# Patient Record
Sex: Male | Born: 1985 | Race: Black or African American | Hispanic: No | Marital: Single | Smoking: Former smoker
Health system: Southern US, Community
[De-identification: ages and names within clinical notes are randomized; demographics above are authoritative.]

## PROBLEM LIST (undated history)

## (undated) DIAGNOSIS — B019 Varicella without complication: Secondary | ICD-10-CM

## (undated) DIAGNOSIS — J45909 Unspecified asthma, uncomplicated: Secondary | ICD-10-CM

## (undated) HISTORY — DX: Unspecified asthma, uncomplicated: J45.909

## (undated) HISTORY — DX: Varicella without complication: B01.9

## (undated) HISTORY — PX: HAND SURGERY: SHX662

---

## 2015-05-26 ENCOUNTER — Ambulatory Visit: Payer: Self-pay | Admitting: Family

## 2016-09-14 ENCOUNTER — Ambulatory Visit (INDEPENDENT_AMBULATORY_CARE_PROVIDER_SITE_OTHER): Payer: BC Managed Care – PPO | Admitting: Family

## 2016-09-14 ENCOUNTER — Encounter: Payer: Self-pay | Admitting: Family

## 2016-09-14 VITALS — BP 124/88 | HR 73 | Temp 98.1°F | Resp 16 | Ht 69.0 in | Wt 216.0 lb

## 2016-09-14 DIAGNOSIS — Z0000 Encounter for general adult medical examination without abnormal findings: Secondary | ICD-10-CM | POA: Insufficient documentation

## 2016-09-14 NOTE — Assessment & Plan Note (Signed)
1) Anticipatory Guidance: Discussed importance of wearing a seatbelt while driving and not texting while driving; changing batteries in smoke detector at least once annually; wearing suntan lotion when outside; eating a balanced and moderate diet; getting physical activity at least 30 minutes per day.  2) Immunizations / Screenings / Labs:  Declines tetanus. All other immunizations are up-to-date per recommendations. Obtain HIV antibody for HIV screening. Due for dental exam encouraged to be completed independently. All other screenings are up-to-date per recommendations. Obtain CBC, CMET, and lipid profile.    Overall well exam with minimal risk factors for cardiovascular disease. He is a current some day smoker although endorses very infrequently. Encouraged to cease tobacco use completely to reduce risk for cardiovascular, respiratory, or malignant disease in the future. He exercises regularly and needs a nutritional intake that is moderate, balance, and varied. Continue other healthy lifestyle behaviors and choices. Follow-up prevention exam in 1 year. Follow-up office visit pending blood work as needed.

## 2016-09-14 NOTE — Patient Instructions (Addendum)
Thank you for choosing [facility].  SUMMARY AND INSTRUCTIONS:  Labs:  Please stop by the lab on the lower level of the building for your blood work. Your results will be released to MyChart (or called to you) after review, usually within 72 hours after test completion. If any changes need to be made, you will be notified at that same time.  1.) The lab is open from 7:30am to 5:30 pm Monday-Friday 2.) No appointment is necessary 3.) Fasting (if needed) is 6-8 hours after food and drink; black coffee and water are okay   Follow up:  If your symptoms worsen or fail to improve, please contact our office for further instruction, or in case of emergency go directly to the emergency room at the closest medical facility.    Health Maintenance, Male A healthy lifestyle and preventive care is important for your health and wellness. Ask your health care provider about what schedule of regular examinations is right for you. What should I know about weight and diet?  Eat a Healthy Diet  Eat plenty of vegetables, fruits, whole grains, low-fat dairy products, and lean protein.  Do not eat a lot of foods high in solid fats, added sugars, or salt. Maintain a Healthy Weight  Regular exercise can help you achieve or maintain a healthy weight. You should:  Do at least 150 minutes of exercise each week. The exercise should increase your heart rate and make you sweat (moderate-intensity exercise).  Do strength-training exercises at least twice a week. Watch Your Levels of Cholesterol and Blood Lipids  Have your blood tested for lipids and cholesterol every 5 years starting at 32 years of age. If you are at high risk for heart disease, you should start having your blood tested when you are 32 years old. You may need to have your cholesterol levels checked more often if:  Your lipid or cholesterol levels are high.  You are older than 32 years of age.  You are at high risk for heart  disease. What should I know about cancer screening? Many types of cancers can be detected early and may often be prevented. Lung Cancer  You should be screened every year for lung cancer if:  You are a current smoker who has smoked for at least 30 years.  You are a former smoker who has quit within the past 15 years.  Talk to your health care provider about your screening options, when you should start screening, and how often you should be screened. Colorectal Cancer  Routine colorectal cancer screening usually begins at 32 years of age and should be repeated every 5-10 years until you are 32 years old. You may need to be screened more often if early forms of precancerous polyps or small growths are found. Your health care provider may recommend screening at an earlier age if you have risk factors for colon cancer.  Your health care provider may recommend using home test kits to check for hidden blood in the stool.  A small camera at the end of a tube can be used to examine your colon (sigmoidoscopy or colonoscopy). This checks for the earliest forms of colorectal cancer. Prostate and Testicular Cancer  Depending on your age and overall health, your health care provider may do certain tests to screen for prostate and testicular cancer.  Talk to your health care provider about any symptoms or concerns you have about testicular or prostate cancer. Skin Cancer  Check your skin from head to   toe regularly.  Tell your health care provider about any new moles or changes in moles, especially if:  There is a change in a mole's size, shape, or color.  You have a mole that is larger than a pencil eraser.  Always use sunscreen. Apply sunscreen liberally and repeat throughout the day.  Protect yourself by wearing long sleeves, pants, a wide-brimmed hat, and sunglasses when outside. What should I know about heart disease, diabetes, and high blood pressure?  If you are 18-39 years of age,  have your blood pressure checked every 3-5 years. If you are 40 years of age or older, have your blood pressure checked every year. You should have your blood pressure measured twice-once when you are at a hospital or clinic, and once when you are not at a hospital or clinic. Record the average of the two measurements. To check your blood pressure when you are not at a hospital or clinic, you can use:  An automated blood pressure machine at a pharmacy.  A home blood pressure monitor.  Talk to your health care provider about your target blood pressure.  If you are between 45-79 years old, ask your health care provider if you should take aspirin to prevent heart disease.  Have regular diabetes screenings by checking your fasting blood sugar level.  If you are at a normal weight and have a low risk for diabetes, have this test once every three years after the age of 45.  If you are overweight and have a high risk for diabetes, consider being tested at a younger age or more often.  A one-time screening for abdominal aortic aneurysm (AAA) by ultrasound is recommended for men aged 65-75 years who are current or former smokers. What should I know about preventing infection? Hepatitis B  If you have a higher risk for hepatitis B, you should be screened for this virus. Talk with your health care provider to find out if you are at risk for hepatitis B infection. Hepatitis C  Blood testing is recommended for:  Everyone born from 1945 through 1965.  Anyone with known risk factors for hepatitis C. Sexually Transmitted Diseases (STDs)  You should be screened each year for STDs including gonorrhea and chlamydia if:  You are sexually active and are younger than 32 years of age.  You are older than 32 years of age and your health care provider tells you that you are at risk for this type of infection.  Your sexual activity has changed since you were last screened and you are at an increased risk for  chlamydia or gonorrhea. Ask your health care provider if you are at risk.  Talk with your health care provider about whether you are at high risk of being infected with HIV. Your health care provider may recommend a prescription medicine to help prevent HIV infection. What else can I do?  Schedule regular health, dental, and eye exams.  Stay current with your vaccines (immunizations).  Do not use any tobacco products, such as cigarettes, chewing tobacco, and e-cigarettes. If you need help quitting, ask your health care provider.  Limit alcohol intake to no more than 2 drinks per day. One drink equals 12 ounces of beer, 5 ounces of wine, or 1 ounces of hard liquor.  Do not use street drugs.  Do not share needles.  Ask your health care provider for help if you need support or information about quitting drugs.  Tell your health care provider if you often   feel depressed.  Tell your health care provider if you have ever been abused or do not feel safe at home. This information is not intended to replace advice given to you by your health care provider. Make sure you discuss any questions you have with your health care provider. Document Released: 10/08/2007 Document Revised: 12/09/2015 Document Reviewed: 01/13/2015 Elsevier Interactive Patient Education  2017 [employer]   

## 2016-09-14 NOTE — Progress Notes (Signed)
Subjective:    Patient ID: Danny Rubio, male    DOB: 09/24/1985, 31 y.o.   MRN: [id number]  Chief Complaint  Patient presents with  . Establish Care    CPE, not fasting    HPI:  Danny Rubio is a 31 y.o. male who presents today for an annual wellness visit.   1) Health Maintenance -   Diet - Averages about 2 meals per day consisting of a low fat diet; Minimal caffeine intake  Exercise - 3-4x per week mixture of cardio and resistance   2) Preventative Exams / Immunizations:  Dental -- Due for exam  Vision -- Up to date   Health Maintenance  Topic Date Due  . HIV Screening  07/02/1999  . TETANUS/TDAP  07/02/2003  . INFLUENZA VACCINE  11/23/2016     There is no immunization history on file for this patient.   No Known Allergies   No outpatient prescriptions prior to visit.   No facility-administered medications prior to visit.      Past Medical History:  Diagnosis Date  . Asthma    As a child  . Chicken pox      Past Surgical History:  Procedure Laterality Date  . HAND SURGERY       Family History  Problem Relation Age of Onset  . Hypertension Mother      Social History   Social History  . Marital status: Single    Spouse name: N/A  . Number of children: 0  . Years of education: 12   Occupational History  . Custodian    Social History Main Topics  . Smoking status: Current Some Day Smoker    Types: Cigarettes  . Smokeless tobacco: Never Used  . Alcohol use Yes     Comment: Occasional  . Drug use: No  . Sexual activity: Not on file   Other Topics Concern  . Not on file   Social History Narrative   Fun/Hobby: Gym, walk, running, swimming      Review of Systems  Constitutional: Denies fever, chills, fatigue, or significant weight gain/loss. HENT: Head: Denies headache or neck pain Ears: Denies changes in hearing, ringing in ears, earache, drainage Nose: Denies discharge, stuffiness, itching, nosebleed, sinus  pain Throat: Denies sore throat, hoarseness, dry mouth, sores, thrush Eyes: Denies loss/changes in vision, pain, redness, blurry/double vision, flashing lights Cardiovascular: Denies chest pain/discomfort, tightness, palpitations, shortness of breath with activity, difficulty lying down, swelling, sudden awakening with shortness of breath Respiratory: Denies shortness of breath, cough, sputum production, wheezing Gastrointestinal: Denies dysphasia, heartburn, change in appetite, nausea, change in bowel habits, rectal bleeding, constipation, diarrhea, yellow skin or eyes Genitourinary: Denies frequency, urgency, burning/pain, blood in urine, incontinence, change in urinary strength. Musculoskeletal: Denies muscle/joint pain, stiffness, back pain, redness or swelling of joints, trauma Skin: Denies rashes, lumps, itching, dryness, color changes, or hair/nail changes Neurological: Denies dizziness, fainting, seizures, weakness, numbness, tingling, tremor Psychiatric - Denies nervousness, stress, depression or memory loss Endocrine: Denies heat or cold intolerance, sweating, frequent urination, excessive thirst, changes in appetite Hematologic: Denies ease of bruising or bleeding     Objective:     BP 124/88 (BP Location: Left Arm, Patient Position: Sitting, Cuff Size: Large)   Pulse 73   Temp 98.1 F (36.7 C) (Oral)   Resp 16   Ht 5\' 9"  (1.753 m)   Wt 216 lb (98 kg)   SpO2 97%   BMI 31.90 kg/m  Nursing note and vital signs reviewed.  Physical Exam  Constitutional: He is oriented to person, place, and time. He appears well-developed and well-nourished.  HENT:  Head: Normocephalic.  Right Ear: Hearing, tympanic membrane, external ear and ear canal normal.  Left Ear: Hearing, tympanic membrane, external ear and ear canal normal.  Nose: Nose normal.  Mouth/Throat: Uvula is midline, oropharynx is clear and moist and mucous membranes are normal.  Eyes: Conjunctivae and EOM are normal.  Pupils are equal, round, and reactive to light.  Neck: Neck supple. No JVD present. No tracheal deviation present. No thyromegaly present.  Cardiovascular: Normal rate, regular rhythm, normal heart sounds and intact distal pulses.   Pulmonary/Chest: Effort normal and breath sounds normal.  Abdominal: Soft. Bowel sounds are normal. He exhibits no distension and no mass. There is no tenderness. There is no rebound and no guarding.  Musculoskeletal: Normal range of motion. He exhibits no edema or tenderness.  Lymphadenopathy:    He has no cervical adenopathy.  Neurological: He is alert and oriented to person, place, and time. He has normal reflexes. No cranial nerve deficit. He exhibits normal muscle tone. Coordination normal.  Skin: Skin is warm and dry.  Psychiatric: He has a normal mood and affect. His behavior is normal. Judgment and thought content normal.       Assessment & Plan:   Problem List Items Addressed This Visit      Other   Routine adult health maintenance - Primary    1) Anticipatory Guidance: Discussed importance of wearing a seatbelt while driving and not texting while driving; changing batteries in smoke detector at least once annually; wearing suntan lotion when outside; eating a balanced and moderate diet; getting physical activity at least 30 minutes per day.  2) Immunizations / Screenings / Labs:  Declines tetanus. All other immunizations are up-to-date per recommendations. Obtain HIV antibody for HIV screening. Due for dental exam encouraged to be completed independently. All other screenings are up-to-date per recommendations. Obtain CBC, CMET, and lipid profile.    Overall well exam with minimal risk factors for cardiovascular disease. He is a current some day smoker although endorses very infrequently. Encouraged to cease tobacco use completely to reduce risk for cardiovascular, respiratory, or malignant disease in the future. He exercises regularly and needs a  nutritional intake that is moderate, balance, and varied. Continue other healthy lifestyle behaviors and choices. Follow-up prevention exam in 1 year. Follow-up office visit pending blood work as needed.      Relevant Orders   CBC   Comprehensive metabolic panel   Lipid panel   HIV antibody       Mr. Caraway does not currently have medications on file.   Follow-up: Return in about 1 year (around 09/14/2017), or if symptoms worsen or fail to improve.   Jeanine Luz, FNP

## 2016-09-16 ENCOUNTER — Other Ambulatory Visit (INDEPENDENT_AMBULATORY_CARE_PROVIDER_SITE_OTHER): Payer: BC Managed Care – PPO

## 2016-09-16 DIAGNOSIS — Z0000 Encounter for general adult medical examination without abnormal findings: Secondary | ICD-10-CM

## 2016-09-16 LAB — CBC
HCT: 45.7 % (ref 39.0–52.0)
Hemoglobin: 15.3 g/dL (ref 13.0–17.0)
MCHC: 33.5 g/dL (ref 30.0–36.0)
MCV: 90.3 fl (ref 78.0–100.0)
PLATELETS: 253 10*3/uL (ref 150.0–400.0)
RBC: 5.06 Mil/uL (ref 4.22–5.81)
RDW: 12.7 % (ref 11.5–15.5)
WBC: 4.9 10*3/uL (ref 4.0–10.5)

## 2016-09-16 LAB — LIPID PANEL
Cholesterol: 191 mg/dL (ref 0–200)
HDL: 26.3 mg/dL — AB (ref >39.00)
LDL Cholesterol: 130 mg/dL — ABNORMAL HIGH (ref 0–99)
NonHDL: 164.54
TRIGLYCERIDES: 173 mg/dL — AB (ref 0.0–149.0)
Total CHOL/HDL Ratio: 7
VLDL: 34.6 mg/dL (ref 0.0–40.0)

## 2016-09-16 LAB — COMPREHENSIVE METABOLIC PANEL
ALT: 31 U/L (ref 0–53)
AST: 20 U/L (ref 0–37)
Albumin: 4.6 g/dL (ref 3.5–5.2)
Alkaline Phosphatase: 56 U/L (ref 39–117)
BUN: 22 mg/dL (ref 6–23)
CHLORIDE: 108 meq/L (ref 96–112)
CO2: 23 meq/L (ref 19–32)
CREATININE: 1 mg/dL (ref 0.40–1.50)
Calcium: 9.4 mg/dL (ref 8.4–10.5)
GFR: 111.22 mL/min (ref >60.00)
Glucose, Bld: 113 mg/dL — ABNORMAL HIGH (ref 70–99)
Potassium: 3.9 mEq/L (ref 3.5–5.1)
Sodium: 138 mEq/L (ref 135–145)
Total Bilirubin: 0.4 mg/dL (ref 0.2–1.2)
Total Protein: 7.6 g/dL (ref 6.0–8.3)

## 2016-09-17 LAB — HIV ANTIBODY (ROUTINE TESTING W REFLEX): HIV: NONREACTIVE

## 2016-12-03 ENCOUNTER — Encounter (HOSPITAL_COMMUNITY): Payer: Self-pay | Admitting: Emergency Medicine

## 2016-12-03 ENCOUNTER — Emergency Department (HOSPITAL_COMMUNITY)
Admission: EM | Admit: 2016-12-03 | Discharge: 2016-12-03 | Disposition: A | Discharge status: Home / Self Care | Payer: BC Managed Care – PPO | Attending: Emergency Medicine | Admitting: Emergency Medicine

## 2016-12-03 DIAGNOSIS — F17210 Nicotine dependence, cigarettes, uncomplicated: Secondary | ICD-10-CM | POA: Insufficient documentation

## 2016-12-03 DIAGNOSIS — J45909 Unspecified asthma, uncomplicated: Secondary | ICD-10-CM | POA: Diagnosis not present

## 2016-12-03 DIAGNOSIS — J029 Acute pharyngitis, unspecified: Secondary | ICD-10-CM | POA: Insufficient documentation

## 2016-12-03 LAB — RAPID STREP SCREEN (MED CTR MEBANE ONLY): Streptococcus, Group A Screen (Direct): NEGATIVE

## 2016-12-03 MED ORDER — AMOXICILLIN 500 MG PO CAPS: 500.0000 mg | ORAL_CAPSULE | Freq: Three times a day (TID) | ORAL | 0 refills | Status: DC

## 2016-12-03 NOTE — ED Provider Notes (Signed)
[facility]-EMERGENCY DEPT Provider Note   CSN: [id number] Arrival date & time: 12/03/16  1026     History   Chief Complaint Chief Complaint  Patient presents with  . Sore Throat    HPI Danny Rubio is a 31 y.o. male.  The history is provided by the patient. No language interpreter was used.  Sore Throat  This is a new problem. The problem occurs constantly. The problem has been gradually worsening. Nothing aggravates the symptoms. He has tried nothing for the symptoms. The treatment provided no relief.   Pt exposed to a coworker with strep Past Medical History:  Diagnosis Date  . Asthma    As a child  . Chicken pox     Patient Active Problem List   Diagnosis Date Noted  . Routine adult health maintenance 09/14/2016    Past Surgical History:  Procedure Laterality Date  . HAND SURGERY         Home Medications    Prior to Admission medications   Medication Sig Start Date End Date Taking? Authorizing Provider  amoxicillin (AMOXIL) 500 MG capsule Take 1 capsule (500 mg total) by mouth 3 (three) times daily. 12/03/16   Elson Areas, PA-C    Family History Family History  Problem Relation Age of Onset  . Hypertension Mother     Social History Social History  Substance Use Topics  . Smoking status: Current Some Day Smoker    Types: Cigarettes  . Smokeless tobacco: Never Used  . Alcohol use Yes     Comment: Occasional     Allergies   Patient has no known allergies.   Review of Systems Review of Systems  All other systems reviewed and are negative.    Physical Exam Updated Vital Signs BP 119/82 (BP Location: Left Arm)   Pulse 65   Temp 98.1 F (36.7 C) (Oral)   Resp 18   SpO2 98%   Physical Exam  Constitutional: He appears well-developed and well-nourished.  HENT:  Head: Normocephalic and atraumatic.  Mouth/Throat: No oropharyngeal exudate.  Erythema throat, no exudate  Eyes: Conjunctivae are normal.  Neck: Neck supple.    Cardiovascular: Normal rate and regular rhythm.   No murmur heard. Pulmonary/Chest: Effort normal and breath sounds normal. No respiratory distress.  Abdominal: Soft. There is no tenderness.  Musculoskeletal: He exhibits no edema.  Neurological: He is alert.  Skin: Skin is warm and dry.  Psychiatric: He has a normal mood and affect.  Nursing note and vitals reviewed.    ED Treatments / Results  Labs (all labs ordered are listed, but only abnormal results are displayed) Labs Reviewed  RAPID STREP SCREEN (NOT AT [facility])  CULTURE, GROUP A STREP [facility])    EKG  EKG Interpretation None       Radiology No results found.  Procedures Procedures (including critical care time)  Medications Ordered in ED Medications - No data to display   Initial Impression / Assessment and Plan / ED Course  I have reviewed the triage vital signs and the nursing notes.  Pertinent labs & imaging results that were available during my care of the patient were reviewed by me and considered in my medical decision making (see chart for details).       Final Clinical Impressions(s) / ED Diagnoses   Final diagnoses:  Acute pharyngitis, unspecified etiology    New Prescriptions New Prescriptions   AMOXICILLIN (AMOXIL) 500 MG CAPSULE    Take 1 capsule (500 mg total) by  mouth 3 (three) times daily.  An After Visit Summary was printed and given to the patient.   Elson AreasSofia, Leslie K, Cordelia Poche-C 12/03/16 1112    Doug SouJacubowitz, Sam, MD 12/03/16 1746

## 2016-12-03 NOTE — ED Triage Notes (Signed)
Pt to ER for evaluation of sore throat x2 weeks. No fever. States was around someone with similar symptoms who had strep. Tonsils appear WDL.

## 2016-12-03 NOTE — Discharge Instructions (Signed)
Return if any problems.

## 2016-12-05 LAB — CULTURE, GROUP A STREP (THRC)

## 2017-04-21 ENCOUNTER — Telehealth: Payer: Self-pay | Admitting: Internal Medicine

## 2017-04-21 NOTE — Telephone Encounter (Signed)
appt notes changed

## 2017-04-21 NOTE — Telephone Encounter (Signed)
[facility] , previous [facility] Pt, will you except and see patient for his CPE  That is scheduled, this appt was not approved.

## 2017-04-21 NOTE — Telephone Encounter (Signed)
Ok to keep him scheduled - I will accept him

## 2017-05-03 ENCOUNTER — Encounter: Payer: BC Managed Care – PPO | Admitting: Internal Medicine

## 2017-05-08 ENCOUNTER — Encounter (HOSPITAL_COMMUNITY): Payer: Self-pay

## 2017-05-08 ENCOUNTER — Other Ambulatory Visit: Payer: Self-pay

## 2017-05-08 ENCOUNTER — Emergency Department (HOSPITAL_COMMUNITY)
Admission: EM | Admit: 2017-05-08 | Discharge: 2017-05-08 | Disposition: A | Discharge status: Home / Self Care | Payer: BC Managed Care – PPO | Attending: Emergency Medicine | Admitting: Emergency Medicine

## 2017-05-08 ENCOUNTER — Emergency Department (HOSPITAL_COMMUNITY): Payer: BC Managed Care – PPO

## 2017-05-08 DIAGNOSIS — F17210 Nicotine dependence, cigarettes, uncomplicated: Secondary | ICD-10-CM | POA: Insufficient documentation

## 2017-05-08 DIAGNOSIS — Z79899 Other long term (current) drug therapy: Secondary | ICD-10-CM | POA: Diagnosis not present

## 2017-05-08 DIAGNOSIS — Y9389 Activity, other specified: Secondary | ICD-10-CM | POA: Diagnosis not present

## 2017-05-08 DIAGNOSIS — Y999 Unspecified external cause status: Secondary | ICD-10-CM | POA: Diagnosis not present

## 2017-05-08 DIAGNOSIS — Y92410 Unspecified street and highway as the place of occurrence of the external cause: Secondary | ICD-10-CM | POA: Diagnosis not present

## 2017-05-08 DIAGNOSIS — S39012A Strain of muscle, fascia and tendon of lower back, initial encounter: Secondary | ICD-10-CM | POA: Diagnosis not present

## 2017-05-08 DIAGNOSIS — J45909 Unspecified asthma, uncomplicated: Secondary | ICD-10-CM | POA: Diagnosis not present

## 2017-05-08 DIAGNOSIS — S3992XA Unspecified injury of lower back, initial encounter: Secondary | ICD-10-CM | POA: Diagnosis present

## 2017-05-08 DIAGNOSIS — M25561 Pain in right knee: Secondary | ICD-10-CM | POA: Diagnosis not present

## 2017-05-08 MED ORDER — IBUPROFEN 600 MG PO TABS: 600.0000 mg | ORAL_TABLET | Freq: Four times a day (QID) | ORAL | 0 refills | Status: DC | PRN

## 2017-05-08 MED ORDER — ACETAMINOPHEN 500 MG PO TABS: 500.0000 mg | ORAL_TABLET | Freq: Four times a day (QID) | ORAL | 0 refills | Status: DC | PRN

## 2017-05-08 MED ORDER — METHOCARBAMOL 500 MG PO TABS: 500.0000 mg | ORAL_TABLET | Freq: Two times a day (BID) | ORAL | 0 refills | Status: DC
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
## 2017-05-08 NOTE — ED Notes (Signed)
Declined W/C at D/C and was escorted to lobby by RN. 

## 2017-05-29 NOTE — Progress Notes (Signed)
Subjective:    Patient ID: Danny Rubio, male    DOB: 09/08/1985, 32 y.o.   MRN: [id number]  HPI He is here for a physical exam.   Allergies, asthma: He has had allergies and asthma since he was a child.  He has some intermittent symptoms, but does not take any medication for them.  He will get some intermittent coughing and wheezing when he is exposed to a strong odor and sometimes when he laughs.  He does not have an inhaler.  Usually his symptoms resolve on their own after short period of time.  He also experiences allergy symptoms.  He does not take any allergy medication.  He has been experiencing increased anxiety and stress.  His grandmother is sick and lives in [place].  He is trying to get up to see her in to be there to help her.  He does not feel that he needs any medication, just being up there would reduce his stress.  He denies any depression.  He is concerned about his heart.  He has been experiencing palpitations intermittently.  He wanted to make sure all of his blood work and his heart was okay.     Medications and allergies reviewed with patient and updated if appropriate.  Patient Active Problem List   Diagnosis Date Noted  . Allergic rhinitis 05/30/2017  . Asthma 05/30/2017  . Anxiety 05/30/2017  . Palpitations 05/30/2017  . Hyperglycemia 05/30/2017    No current outpatient medications on file prior to visit.   No current facility-administered medications on file prior to visit.     Past Medical History:  Diagnosis Date  . Asthma    As a child  . Chicken pox     Past Surgical History:  Procedure Laterality Date  . HAND SURGERY      Social History   Socioeconomic History  . Marital status: Single    Spouse name: None  . Number of children: 0  . Years of education: 7612  . Highest education level: None  Social Needs  . Financial resource strain: None  . Food insecurity - worry: None  . Food insecurity - inability: None  .  Transportation needs - medical: None  . Transportation needs - non-medical: None  Occupational History  . Occupation: Custodian  Tobacco Use  . Smoking status: Former Smoker    Types: Cigarettes  . Smokeless tobacco: Never Used  Substance and Sexual Activity  . Alcohol use: Yes    Comment: Occasional  . Drug use: Yes    Types: Marijuana    Comment: occasional  . Sexual activity: None  Other Topics Concern  . None  Social History Narrative   Fun/Hobby: Gym, walk, running, swimming    Family History  Problem Relation Age of Onset  . Hypertension Mother   . Asthma Maternal Grandmother     Review of Systems  Constitutional: Negative for chills, fatigue (fair) and fever.  HENT: Positive for congestion (with strong odors). Negative for ear pain and sore throat.   Eyes: Negative for visual disturbance.  Respiratory: Positive for cough and wheezing (cough and wheeze with strong odors, laughing). Negative for shortness of breath.   Cardiovascular: Positive for palpitations. Negative for chest pain and leg swelling.  Gastrointestinal: Negative for abdominal pain, blood in stool, constipation, diarrhea and nausea.       No GERD  Genitourinary: Negative for dysuria and hematuria.  Musculoskeletal: Negative for arthralgias and back pain.  Skin: Negative  for color change and rash.  Neurological: Negative for dizziness, light-headedness, numbness and headaches.  Psychiatric/Behavioral: Negative for dysphoric mood. The patient is nervous/anxious.        Objective:   Vitals:   05/30/17 0939  BP: 106/80  Pulse: 78  Resp: 16  Temp: 98.1 F (36.7 C)  SpO2: 98%   Filed Weights   05/30/17 0939  Weight: 216 lb (98 kg)   Body mass index is 31.9 kg/m.  Wt Readings from Last 3 Encounters:  05/30/17 216 lb (98 kg)  05/08/17 215 lb (97.5 kg)  09/14/16 216 lb (98 kg)     Physical Exam Constitutional: He appears well-developed and well-nourished. No distress.  HENT:  Head:  Normocephalic and atraumatic.  Right Ear: External ear normal.  Left Ear: External ear normal.  Mouth/Throat: Oropharynx is clear and moist.  Normal ear canals and TM b/l  Eyes: Conjunctivae and EOM are normal.  Neck: Neck supple. No tracheal deviation present. No thyromegaly present.  No carotid bruit  Cardiovascular: Normal rate, regular rhythm, normal heart sounds and intact distal pulses.   No murmur heard. Pulmonary/Chest: Effort normal and breath sounds normal. No respiratory distress. He has no wheezes. He has no rales.  Abdominal: Soft. He exhibits no distension. There is no tenderness.  Genitourinary: deferred  Musculoskeletal: He exhibits no edema.  Lymphadenopathy:   He has no cervical adenopathy.  Skin: Skin is warm and dry. He is not diaphoretic.  Psychiatric: He has a normal mood and affect. His behavior is normal.         Assessment & Plan:   Physical exam: Screening blood work  ordered Immunizations  Deferred td, deferred flu Exercise  regular Weight  Mildly overweight Skin   No concerns Substance abuse   Smokes marijuana occasionally, no other abuse  See Problem List for Assessment and Plan of chronic medical problems.  FU in one year

## 2017-05-30 ENCOUNTER — Other Ambulatory Visit (INDEPENDENT_AMBULATORY_CARE_PROVIDER_SITE_OTHER): Payer: BC Managed Care – PPO

## 2017-05-30 ENCOUNTER — Ambulatory Visit (INDEPENDENT_AMBULATORY_CARE_PROVIDER_SITE_OTHER): Payer: BC Managed Care – PPO | Admitting: Internal Medicine

## 2017-05-30 ENCOUNTER — Encounter: Payer: Self-pay | Admitting: Internal Medicine

## 2017-05-30 VITALS — BP 106/80 | HR 78 | Temp 98.1°F | Resp 16 | Ht 69.0 in | Wt 216.0 lb

## 2017-05-30 DIAGNOSIS — Z0000 Encounter for general adult medical examination without abnormal findings: Secondary | ICD-10-CM

## 2017-05-30 DIAGNOSIS — R002 Palpitations: Secondary | ICD-10-CM

## 2017-05-30 DIAGNOSIS — Z720 Tobacco use: Secondary | ICD-10-CM | POA: Insufficient documentation

## 2017-05-30 DIAGNOSIS — J309 Allergic rhinitis, unspecified: Secondary | ICD-10-CM | POA: Diagnosis not present

## 2017-05-30 DIAGNOSIS — F419 Anxiety disorder, unspecified: Secondary | ICD-10-CM | POA: Insufficient documentation

## 2017-05-30 DIAGNOSIS — J45909 Unspecified asthma, uncomplicated: Secondary | ICD-10-CM | POA: Insufficient documentation

## 2017-05-30 DIAGNOSIS — R739 Hyperglycemia, unspecified: Secondary | ICD-10-CM

## 2017-05-30 DIAGNOSIS — J4520 Mild intermittent asthma, uncomplicated: Secondary | ICD-10-CM

## 2017-05-30 LAB — COMPREHENSIVE METABOLIC PANEL
ALK PHOS: 62 U/L (ref 39–117)
ALT: 22 U/L (ref 0–53)
AST: 18 U/L (ref 0–37)
Albumin: 4.4 g/dL (ref 3.5–5.2)
BILIRUBIN TOTAL: 0.4 mg/dL (ref 0.2–1.2)
BUN: 15 mg/dL (ref 6–23)
CALCIUM: 9.2 mg/dL (ref 8.4–10.5)
CO2: 29 meq/L (ref 19–32)
CREATININE: 0.84 mg/dL (ref 0.40–1.50)
Chloride: 106 mEq/L (ref 96–112)
GFR: 135.41 mL/min (ref >60.00)
GLUCOSE: 96 mg/dL (ref 70–99)
Potassium: 3.7 mEq/L (ref 3.5–5.1)
Sodium: 142 mEq/L (ref 135–145)
TOTAL PROTEIN: 7.4 g/dL (ref 6.0–8.3)

## 2017-05-30 LAB — CBC WITH DIFFERENTIAL/PLATELET
BASOS ABS: 0.1 10*3/uL (ref 0.0–0.1)
Basophils Relative: 1.4 % (ref 0.0–3.0)
EOS ABS: 0.1 10*3/uL (ref 0.0–0.7)
Eosinophils Relative: 2.9 % (ref 0.0–5.0)
HCT: 44.6 % (ref 39.0–52.0)
Hemoglobin: 15 g/dL (ref 13.0–17.0)
LYMPHS ABS: 1.8 10*3/uL (ref 0.7–4.0)
LYMPHS PCT: 37.2 % (ref 12.0–46.0)
MCHC: 33.5 g/dL (ref 30.0–36.0)
MCV: 91.9 fl (ref 78.0–100.0)
MONOS PCT: 6.9 % (ref 3.0–12.0)
Monocytes Absolute: 0.3 10*3/uL (ref 0.1–1.0)
NEUTROS ABS: 2.5 10*3/uL (ref 1.4–7.7)
NEUTROS PCT: 51.6 % (ref 43.0–77.0)
PLATELETS: 291 10*3/uL (ref 150.0–400.0)
RBC: 4.85 Mil/uL (ref 4.22–5.81)
RDW: 12.6 % (ref 11.5–15.5)
WBC: 4.9 10*3/uL (ref 4.0–10.5)

## 2017-05-30 LAB — LIPID PANEL
Cholesterol: 163 mg/dL (ref 0–200)
HDL: 38.1 mg/dL — AB (ref >39.00)
LDL Cholesterol: 107 mg/dL — ABNORMAL HIGH (ref 0–99)
NonHDL: 125.19
TRIGLYCERIDES: 93 mg/dL (ref 0.0–149.0)
Total CHOL/HDL Ratio: 4
VLDL: 18.6 mg/dL (ref 0.0–40.0)

## 2017-05-30 LAB — TSH: TSH: 3.92 u[IU]/mL (ref 0.35–4.50)

## 2017-05-30 LAB — HEMOGLOBIN A1C: HEMOGLOBIN A1C: 5.9 % (ref 4.6–6.5)

## 2017-05-30 NOTE — Assessment & Plan Note (Signed)
Discussed otc allergy medications - advised him to try them as needed Can refer to allergy if needed

## 2017-05-30 NOTE — Assessment & Plan Note (Signed)
Occasional wheeze/cough with strong odors, laughing Discussed a rescue inhaler as needed - deferred for now - he will let me know if he wants one

## 2017-05-30 NOTE — Assessment & Plan Note (Signed)
a1c

## 2017-05-30 NOTE — Assessment & Plan Note (Signed)
Intermittent Likely related to anxiety/stress Will check labs - cmp, tsh, cbc EKG today - Sinus Rhythm 60 bpm, Anterolateral ST-elevation -repolarization variant. Normal EKG, no prior to compare

## 2017-05-30 NOTE — Patient Instructions (Addendum)
Test(s) ordered today. Your results will be released to MyChart (or called to you) after review, usually within 72hours after test completion. If any changes need to be made, you will be notified at that same time.  All other Health Maintenance issues reviewed.   All recommended immunizations and age-appropriate screenings are up-to-date or discussed.  No immunizations administered today.  An EKG was done today.    Medications reviewed and updated.  No changes recommended at this time.  Let me know if you need an inhaler for your asthma.      Please followup in one year    Health Maintenance, Male A healthy lifestyle and preventive care is important for your health and wellness. Ask your health care provider about what schedule of regular examinations is right for you. What should I know about weight and diet? Eat a Healthy Diet  Eat plenty of vegetables, fruits, whole grains, low-fat dairy products, and lean protein.  Do not eat a lot of foods high in solid fats, added sugars, or salt.  Maintain a Healthy Weight Regular exercise can help you achieve or maintain a healthy weight. You should:  Do at least 150 minutes of exercise each week. The exercise should increase your heart rate and make you sweat (moderate-intensity exercise).  Do strength-training exercises at least twice a week.  Watch Your Levels of Cholesterol and Blood Lipids  Have your blood tested for lipids and cholesterol every 5 years starting at 33 years of age. If you are at high risk for heart disease, you should start having your blood tested when you are 33 years old. You may need to have your cholesterol levels checked more often if: ? Your lipid or cholesterol levels are high. ? You are older than 33 years of age. ? You are at high risk for heart disease.  What should I know about cancer screening? Many types of cancers can be detected early and may often be prevented. Lung Cancer  You should be  screened every year for lung cancer if: ? You are a current smoker who has smoked for at least 30 years. ? You are a former smoker who has quit within the past 15 years.  Talk to your health care provider about your screening options, when you should start screening, and how often you should be screened.  Colorectal Cancer  Routine colorectal cancer screening usually begins at 33 years of age and should be repeated every 5-10 years until you are 33 years old. You may need to be screened more often if early forms of precancerous polyps or small growths are found. Your health care provider may recommend screening at an earlier age if you have risk factors for colon cancer.  Your health care provider may recommend using home test kits to check for hidden blood in the stool.  A small camera at the end of a tube can be used to examine your colon (sigmoidoscopy or colonoscopy). This checks for the earliest forms of colorectal cancer.  Prostate and Testicular Cancer  Depending on your age and overall health, your health care provider may do certain tests to screen for prostate and testicular cancer.  Talk to your health care provider about any symptoms or concerns you have about testicular or prostate cancer.  Skin Cancer  Check your skin from head to toe regularly.  Tell your health care provider about any new moles or changes in moles, especially if: ? There is a change in a mole's size,  shape, or color. ? You have a mole that is larger than a pencil eraser.  Always use sunscreen. Apply sunscreen liberally and repeat throughout the day.  Protect yourself by wearing long sleeves, pants, a wide-brimmed hat, and sunglasses when outside.  What should I know about heart disease, diabetes, and high blood pressure?  If you are 23-56 years of age, have your blood pressure checked every 3-5 years. If you are 67 years of age or older, have your blood pressure checked every year. You should have  your blood pressure measured twice-once when you are at a hospital or clinic, and once when you are not at a hospital or clinic. Record the average of the two measurements. To check your blood pressure when you are not at a hospital or clinic, you can use: ? An automated blood pressure machine at a pharmacy. ? A home blood pressure monitor.  Talk to your health care provider about your target blood pressure.  If you are between 17-108 years old, ask your health care provider if you should take aspirin to prevent heart disease.  Have regular diabetes screenings by checking your fasting blood sugar level. ? If you are at a normal weight and have a low risk for diabetes, have this test once every three years after the age of 17. ? If you are overweight and have a high risk for diabetes, consider being tested at a younger age or more often.  A one-time screening for abdominal aortic aneurysm (AAA) by ultrasound is recommended for men aged 62-75 years who are current or former smokers. What should I know about preventing infection? Hepatitis B If you have a higher risk for hepatitis B, you should be screened for this virus. Talk with your health care provider to find out if you are at risk for hepatitis B infection. Hepatitis C Blood testing is recommended for:  Everyone born from [birth year] through 1966.  Anyone with known risk factors for hepatitis C.  Sexually Transmitted Diseases (STDs)  You should be screened each year for STDs including gonorrhea and chlamydia if: ? You are sexually active and are younger than 33 years of age. ? You are older than 33 years of age and your health care provider tells you that you are at risk for this type of infection. ? Your sexual activity has changed since you were last screened and you are at an increased risk for chlamydia or gonorrhea. Ask your health care provider if you are at risk.  Talk with your health care provider about whether you are at high risk  of being infected with HIV. Your health care provider may recommend a prescription medicine to help prevent HIV infection.  What else can I do?  Schedule regular health, dental, and eye exams.  Stay current with your vaccines (immunizations).  Do not use any tobacco products, such as cigarettes, chewing tobacco, and e-cigarettes. If you need help quitting, ask your health care provider.  Limit alcohol intake to no more than 2 drinks per day. One drink equals 12 ounces of beer, 5 ounces of wine, or 1 ounces of hard liquor.  Do not use street drugs.  Do not share needles.  Ask your health care provider for help if you need support or information about quitting drugs.  Tell your health care provider if you often feel depressed.  Tell your health care provider if you have ever been abused or do not feel safe at home. This information is not intended  to replace advice given to you by your health care provider. Make sure you discuss any questions you have with your health care provider. Document Released: 10/08/2007 Document Revised: 12/09/2015 Document Reviewed: 01/13/2015 Elsevier Interactive Patient Education  [employer].

## 2017-05-30 NOTE — Assessment & Plan Note (Signed)
Related to his grandmother being sick Deferred medication Will work on getting FMLA from his grandmother's doctor so he can help here

## 2017-05-31 ENCOUNTER — Encounter: Payer: Self-pay | Admitting: Internal Medicine

## 2017-05-31 DIAGNOSIS — R7303 Prediabetes: Secondary | ICD-10-CM | POA: Insufficient documentation

## 2017-06-05 ENCOUNTER — Telehealth: Payer: Self-pay | Admitting: Emergency Medicine

## 2017-06-05 NOTE — Telephone Encounter (Signed)
Copied from CRM [id number]. Topic: Inquiry >> Jun 05, 2017  1:27 PM Landry MellowFoltz, Melissa J wrote: Reason for CRM: pt would like to know his lab results again.  Please call [phone]

## 2017-06-05 NOTE — Telephone Encounter (Signed)
Tried contacting pt, unable to LVM 
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
## 2017-06-06 NOTE — Telephone Encounter (Signed)
Tried contacting pt, unable to LVM 

## 2017-06-06 NOTE — Telephone Encounter (Signed)
Noted.  Correction to below message last sentence he started using inappropriate language and hung up.

## 2017-06-06 NOTE — Telephone Encounter (Signed)
An MRI is not how we rule out blood clots and w/o symptoms his insurance will not cover it.

## 2017-06-06 NOTE — Telephone Encounter (Signed)
Patient called back, he was given the information below. Patient is upset over the fact that he can not have a MRI ordered. I asked if patient has any SX he stated no, he wants this done because of a family history. I also explaining it is not the right test. He just kept saying that someone else told him that is the test he needs.I informed him I could give this informing to Dr.Burns again and someone could follow up with him again over it. He demanded to speak with her now about it. I explain they will have to call back no one is available. He started using improve language and hung up.

## 2017-06-07 NOTE — Telephone Encounter (Signed)
Danny Rubio, discussed his conversation with our staff 06/06/17. He said he became aggravated as he felt he was not being listened to or understood. He is concerned about his family history, but does not have any current symptoms. I advised him to speak with his family members to get clarity about their history and what tests they may or may not be getting done. Also explained that most testing is diagnostic ( not screening ) and requires symptoms for insurance approval. I did advise him that our staff should be treated respectfully, even when the situation is stressful. This does not include inappropriate language.  Advised him to contact me if future concerns.

## 2017-08-08 ENCOUNTER — Ambulatory Visit: Payer: BC Managed Care – PPO | Admitting: Internal Medicine

## 2017-08-25 ENCOUNTER — Ambulatory Visit: Payer: BC Managed Care – PPO | Admitting: Internal Medicine

## 2017-08-27 NOTE — Progress Notes (Deleted)
    Subjective:    Patient ID: Danny Rubio, male    DOB: 1985-12-04, 32 y.o.   MRN: [id number]  HPI The patient is here for an acute visit.  Gynecomastia:    Causes - aging,  Drugs, hyperthyroid, hcg excess, low testo, liver dis, adrenal adenoma, ckd, testicular tumor  Medications and allergies reviewed with patient and updated if appropriate.  Patient Active Problem List   Diagnosis Date Noted  . Prediabetes 05/31/2017  . Allergic rhinitis 05/30/2017  . Asthma 05/30/2017  . Anxiety 05/30/2017  . Palpitations 05/30/2017  . Hyperglycemia 05/30/2017    No current outpatient medications on file prior to visit.   No current facility-administered medications on file prior to visit.     Past Medical History:  Diagnosis Date  . Asthma    As a child  . Chicken pox     Past Surgical History:  Procedure Laterality Date  . HAND SURGERY      Social History   Socioeconomic History  . Marital status: Single    Spouse name: Not on file  . Number of children: 0  . Years of education: 84  . Highest education level: Not on file  Occupational History  . Occupation: Custodian  Social Needs  . Financial resource strain: Not on file  . Food insecurity:    Worry: Not on file    Inability: Not on file  . Transportation needs:    Medical: Not on file    Non-medical: Not on file  Tobacco Use  . Smoking status: Former Smoker    Types: Cigarettes  . Smokeless tobacco: Never Used  Substance and Sexual Activity  . Alcohol use: Yes    Comment: Occasional  . Drug use: Yes    Types: Marijuana    Comment: occasional  . Sexual activity: Not on file  Lifestyle  . Physical activity:    Days per week: Not on file    Minutes per session: Not on file  . Stress: Not on file  Relationships  . Social connections:    Talks on phone: Not on file    Gets together: Not on file    Attends religious service: Not on file    Active member of club or organization: Not on file   Attends meetings of clubs or organizations: Not on file    Relationship status: Not on file  Other Topics Concern  . Not on file  Social History Narrative   Fun/Hobby: Gym, walk, running, swimming    Family History  Problem Relation Age of Onset  . Hypertension Mother   . Asthma Maternal Grandmother     Review of Systems     Objective:  There were no vitals filed for this visit. BP Readings from Last 3 Encounters:  05/30/17 106/80  05/08/17 140/84  12/03/16 119/82   Wt Readings from Last 3 Encounters:  05/30/17 216 lb (98 kg)  05/08/17 215 lb (97.5 kg)  09/14/16 216 lb (98 kg)   There is no height or weight on file to calculate BMI.   Physical Exam         Assessment & Plan:    See Problem List for Assessment and Plan of chronic medical problems.

## 2017-08-28 ENCOUNTER — Ambulatory Visit: Payer: BC Managed Care – PPO | Admitting: Internal Medicine

## 2017-08-28 DIAGNOSIS — Z0289 Encounter for other administrative examinations: Secondary | ICD-10-CM

## 2017-09-05 NOTE — Progress Notes (Signed)
Subjective:    Patient ID: Danny Rubio, male    DOB: March 13, 1986, 32 y.o.   MRN: [id number]  HPI The patient is here for an acute visit.  Gynecomastia:  He feels like he has breast enlargement.  He noticed this a while ago.  It varies in size.  No pain.  He lifts weights.    He exercises at the gym on weekends and does cardio and weights.    He denies difficulty with erections or change in libido.  He otherwise feels well.    He has done research about a medication that could help.   He may want to take it.   Prediabetes:  He is compliant with a low sugar/carbohydrate diet.  He is exercising regularly.    Medications and allergies reviewed with patient and updated if appropriate.  Patient Active Problem List   Diagnosis Date Noted  . Prediabetes 05/31/2017  . Allergic rhinitis 05/30/2017  . Asthma 05/30/2017  . Anxiety 05/30/2017  . Palpitations 05/30/2017    No current outpatient medications on file prior to visit.   No current facility-administered medications on file prior to visit.     Past Medical History:  Diagnosis Date  . Asthma    As a child  . Chicken pox     Past Surgical History:  Procedure Laterality Date  . HAND SURGERY      Social History   Socioeconomic History  . Marital status: Single    Spouse name: Not on file  . Number of children: 0  . Years of education: 68  . Highest education level: Not on file  Occupational History  . Occupation: Custodian  Social Needs  . Financial resource strain: Not on file  . Food insecurity:    Worry: Not on file    Inability: Not on file  . Transportation needs:    Medical: Not on file    Non-medical: Not on file  Tobacco Use  . Smoking status: Former Smoker    Types: Cigarettes  . Smokeless tobacco: Never Used  Substance and Sexual Activity  . Alcohol use: Yes    Comment: Occasional  . Drug use: Yes    Types: Marijuana    Comment: occasional  . Sexual activity: Not on file  Lifestyle    . Physical activity:    Days per week: Not on file    Minutes per session: Not on file  . Stress: Not on file  Relationships  . Social connections:    Talks on phone: Not on file    Gets together: Not on file    Attends religious service: Not on file    Active member of club or organization: Not on file    Attends meetings of clubs or organizations: Not on file    Relationship status: Not on file  Other Topics Concern  . Not on file  Social History Narrative   Fun/Hobby: Gym, walk, running, swimming    Family History  Problem Relation Age of Onset  . Hypertension Mother   . Asthma Maternal Grandmother     Review of Systems  Constitutional: Negative for chills and fever.  Respiratory: Negative for shortness of breath.   Cardiovascular: Negative for chest pain, palpitations and leg swelling.  Genitourinary: Negative for scrotal swelling and testicular pain.       No change in libido, erections  Skin:       No change in hair/loss of hair  Neurological: Negative for light-headedness  and headaches.       Objective:   Vitals:   09/07/17 0842  BP: 110/80  Pulse: 69  Resp: 16  Temp: 98.4 F (36.9 C)  SpO2: 98%   BP Readings from Last 3 Encounters:  09/07/17 110/80  05/30/17 106/80  05/08/17 140/84   Wt Readings from Last 3 Encounters:  09/07/17 217 lb (98.4 kg)  05/30/17 216 lb (98 kg)  05/08/17 215 lb (97.5 kg)   Body mass index is 32.05 kg/m.   Physical Exam    Constitutional: He appears well-developed and well-nourished. No distress.  Breasts; excessive breast tissue b/l possibly fat tissue, no abnormal masses/lumps, non tender Skin: Skin is warm and dry. He is not diaphoretic.      Assessment & Plan:    See Problem List for Assessment and Plan of chronic medical problems.

## 2017-09-07 ENCOUNTER — Encounter: Payer: Self-pay | Admitting: Internal Medicine

## 2017-09-07 ENCOUNTER — Ambulatory Visit: Payer: BC Managed Care – PPO | Admitting: Internal Medicine

## 2017-09-07 VITALS — BP 110/80 | HR 69 | Temp 98.4°F | Resp 16 | Wt 217.0 lb

## 2017-09-07 DIAGNOSIS — N62 Hypertrophy of breast: Secondary | ICD-10-CM | POA: Insufficient documentation

## 2017-09-07 DIAGNOSIS — R7303 Prediabetes: Secondary | ICD-10-CM | POA: Diagnosis not present

## 2017-09-07 NOTE — Patient Instructions (Signed)
Have blood work when you can - the lab is open M-F 7:30 - 5:30.  Work on weight loss    We can refer you to a specialist for further evaluation - an endocrinologist.

## 2017-09-07 NOTE — Assessment & Plan Note (Signed)
Check a1c Low sugar / carb diet Stressed regular exercise, weight loss  

## 2017-09-07 NOTE — Assessment & Plan Note (Signed)
No concerning findings on exam Likely related to being overweight - advised weight loss Will check tsh, testosterone level, prolactin, hcg Can consider endo referral

## 2017-09-11 ENCOUNTER — Other Ambulatory Visit (INDEPENDENT_AMBULATORY_CARE_PROVIDER_SITE_OTHER): Payer: BC Managed Care – PPO

## 2017-09-11 DIAGNOSIS — N62 Hypertrophy of breast: Secondary | ICD-10-CM

## 2017-09-11 DIAGNOSIS — R7303 Prediabetes: Secondary | ICD-10-CM | POA: Diagnosis not present

## 2017-09-11 LAB — HEMOGLOBIN A1C: HEMOGLOBIN A1C: 5.7 % (ref 4.6–6.5)

## 2017-09-11 LAB — TSH: TSH: 5.8 u[IU]/mL — AB (ref 0.35–4.50)

## 2017-09-14 ENCOUNTER — Other Ambulatory Visit: Payer: Self-pay | Admitting: Internal Medicine

## 2017-09-14 DIAGNOSIS — R7989 Other specified abnormal findings of blood chemistry: Secondary | ICD-10-CM

## 2017-09-14 DIAGNOSIS — N62 Hypertrophy of breast: Secondary | ICD-10-CM

## 2017-09-14 LAB — PROLACTIN: Prolactin: 6.2 ng/mL (ref 2.0–18.0)

## 2017-09-14 LAB — HCG, SERUM, QUALITATIVE: PREG SERUM: NEGATIVE

## 2017-09-14 LAB — TESTOSTERONE, FREE & TOTAL
Free Testosterone: 43.4 pg/mL (ref 35.0–155.0)
Testosterone, Total, LC-MS-MS: 271 ng/dL (ref 250–1100)

## 2017-09-14 NOTE — Progress Notes (Signed)
ref

## 2017-10-13 ENCOUNTER — Telehealth: Payer: Self-pay | Admitting: Emergency Medicine

## 2017-10-13 NOTE — Telephone Encounter (Signed)
He would need to be seen

## 2017-10-13 NOTE — Telephone Encounter (Signed)
Copied from CRM [id number]. Topic: General - Other >> Oct 12, 2017  2:46 PM Tamela OddiHarris, Brenda J wrote: Reason for CRM: Patient called stating that he needs an out-of-work note for his second job because he is going through a depression.  Please advise.  CB# [phone].  >> Oct 12, 2017  3:17 PM Claris PongBlackwood, Samantha J wrote: LVM for patient to call back an make an appointment. We can not write him our without him being seen.

## 2017-12-11 ENCOUNTER — Ambulatory Visit (INDEPENDENT_AMBULATORY_CARE_PROVIDER_SITE_OTHER): Payer: BC Managed Care – PPO | Admitting: Endocrinology

## 2017-12-11 ENCOUNTER — Other Ambulatory Visit: Payer: Self-pay

## 2017-12-11 ENCOUNTER — Telehealth: Payer: Self-pay | Admitting: Endocrinology

## 2017-12-11 ENCOUNTER — Encounter: Payer: Self-pay | Admitting: Endocrinology

## 2017-12-11 VITALS — BP 114/84 | HR 74 | Temp 98.5°F | Ht 69.0 in | Wt 208.8 lb

## 2017-12-11 DIAGNOSIS — N62 Hypertrophy of breast: Secondary | ICD-10-CM

## 2017-12-11 DIAGNOSIS — E039 Hypothyroidism, unspecified: Secondary | ICD-10-CM | POA: Diagnosis not present

## 2017-12-11 DIAGNOSIS — R7989 Other specified abnormal findings of blood chemistry: Secondary | ICD-10-CM | POA: Insufficient documentation

## 2017-12-11 MED ORDER — LEVOTHYROXINE SODIUM 50 MCG PO TABS: 50.0000 ug | ORAL_TABLET | Freq: Every day | ORAL | 3 refills | Status: DC

## 2017-12-11 NOTE — Telephone Encounter (Signed)
error 

## 2017-12-11 NOTE — Telephone Encounter (Signed)
Patient is at pharmacy & there no prescription there for him to pick up. Please advise he is waiting on his testosterone.

## 2017-12-11 NOTE — Patient Instructions (Signed)
I have sent a prescription to your pharmacy, for the thyroid.  Taking this does not cause you to depend on it, but most people continue to need it. Please go back to the [facility] office, to redo the blood tests in approx 1 month. Please come back for a follow-up appointment in 6 months

## 2017-12-11 NOTE — Telephone Encounter (Signed)
[id number] - [place], [place] - [address] [facility] [address]   Patient said pharmacy told him that they  have not received medication from Dr Everardo Allellison

## 2017-12-11 NOTE — Telephone Encounter (Signed)
Ok, I have resent 

## 2017-12-11 NOTE — Progress Notes (Signed)
Subjective:    Patient ID: Danny Rubio, male    DOB: 02/03/1986, 32 y.o.   MRN: [id number]  HPI Pt is referred by Dr Lawerance BachBurns, for hypogonadism.  Pt reports he had puberty at the normal age.  He has no biological children.  He says he has never taken illicit androgens.  He has never been on any prescribed medication for hypogonadism.  He does not take antiandrogens or opioids.  He denies any h/o infertility, XRT, or genital infection.  He has never had surgery, or a serious injury to the head or genital area. He has no h/o sleep apnea or DVT.   He does not consume alcohol excessively.  He has slight swelling at both breasts, but no assoc pain.   Past Medical History:  Diagnosis Date  . Asthma    As a child  . Chicken pox     Past Surgical History:  Procedure Laterality Date  . HAND SURGERY      Social History   Socioeconomic History  . Marital status: Single    Spouse name: Not on file  . Number of children: 0  . Years of education: 4812  . Highest education level: Not on file  Occupational History  . Occupation: Custodian  Social Needs  . Financial resource strain: Not on file  . Food insecurity:    Worry: Not on file    Inability: Not on file  . Transportation needs:    Medical: Not on file    Non-medical: Not on file  Tobacco Use  . Smoking status: Former Smoker    Types: Cigarettes  . Smokeless tobacco: Never Used  Substance and Sexual Activity  . Alcohol use: Yes    Comment: Occasional  . Drug use: Yes    Types: Marijuana    Comment: occasional  . Sexual activity: Not on file  Lifestyle  . Physical activity:    Days per week: Not on file    Minutes per session: Not on file  . Stress: Not on file  Relationships  . Social connections:    Talks on phone: Not on file    Gets together: Not on file    Attends religious service: Not on file    Active member of club or organization: Not on file    Attends meetings of clubs or organizations: Not on file   Relationship status: Not on file  . Intimate partner violence:    Fear of current or ex partner: Not on file    Emotionally abused: Not on file    Physically abused: Not on file    Forced sexual activity: Not on file  Other Topics Concern  . Not on file  Social History Narrative   Fun/Hobby: Gym, walk, running, swimming    No current outpatient medications on file prior to visit.   No current facility-administered medications on file prior to visit.     No Known Allergies  Family History  Problem Relation Age of Onset  . Hypertension Mother   . Asthma Maternal Grandmother   . Thyroid disease Neg Hx     BP 114/84 (BP Location: Right Arm, Patient Position: Sitting, Cuff Size: Normal)   Pulse 74   Temp 98.5 F (36.9 C) (Oral)   Ht 5\' 9"  (1.753 m)   Wt 208 lb 12.8 oz (94.7 kg)   SpO2 97%   BMI 30.83 kg/m   Review of Systems denies depression, numbness, erectile dysfunction, weight gain, decreased urinary stream,  muscle weakness, fever, headache, easy bruising, sob, rash, blurry vision, rhinorrhea, and chest pain.      Objective:   Physical Exam VS: see vs page GEN: no distress HEAD: head: no deformity eyes: no periorbital swelling, no proptosis external nose and ears are normal mouth: no lesion seen NECK: supple, thyroid is not enlarged CHEST WALL: no deformity LUNGS: clear to auscultation BREASTS:  bilat pseudogynecomastia, but no palpable ductal tissue CV: reg rate and rhythm, no murmur ABD: abdomen is soft, nontender.  no hepatosplenomegaly.  not distended.  no hernia GENITALIA:  Normal male.   MUSCULOSKELETAL: muscle bulk and strength are grossly normal.  no obvious joint swelling.  gait is normal and steady EXTEMITIES: no deformity.  no ulcer on the feet.  feet are of normal color and temp.  No leg edema PULSES: no carotid bruit NEURO:  cn 2-12 grossly intact.   readily moves all 4's.  sensation is intact to touch on all 4's SKIN:  Normal texture and  temperature.  No rash or suspicious lesion is visible.  Normal hair distribution NODES:  None palpable at the neck PSYCH: alert, well-oriented.  Does not appear anxious nor depressed.  I have reviewed outside records, and summarized:  Pt was noted to have elevated TSH, and referred here.  Except for hyperglycemia, he was noted to be in good general health   Lab Results  Component Value Date   TSH 5.80 (H) 09/11/2017   Testosterone=271    Assessment & Plan:  Hypothyroidism: mild.   Low-normal testosterone, new to me.  Synthroid might increase this.   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    Patient Instructions  I have sent a prescription to your pharmacy, for the thyroid.  Taking this does not cause you to depend on it, but most people continue to need it. Please go back to the [facility], to redo the blood tests in approx 1 month. Please come back for a follow-up appointment in 6 months

## 2018-02-03 ENCOUNTER — Ambulatory Visit: Payer: BC Managed Care – PPO | Admitting: Podiatry

## 2018-02-28 ENCOUNTER — Telehealth: Payer: Self-pay

## 2018-02-28 NOTE — Telephone Encounter (Signed)
Copied from CRM [id number]. Topic: General - Other >> Feb 28, 2018  9:37 AM Herby Abraham C wrote: Reason for CRM: pt's called in requesting to speak with his PCP. Pt says that he is requesting a work note due to depression. Pt says that he has not been seen for concern.  CB:   [phone] >> Feb 28, 2018 11:02 AM Claris Pong wrote: Anxiety in diagnosis but not depression. Patient has not been seen for depression

## 2018-02-28 NOTE — Telephone Encounter (Signed)
Pt has an appointment to discuss further with Dr. Lawerance Bach on friday

## 2018-03-01 NOTE — Progress Notes (Deleted)
    Subjective:    Patient ID: Danny Rubio, male    DOB: [birth year], 32 y.o.   MRN: [id number]  HPI The patient is here for an acute visit.   Depression:    Medications and allergies reviewed with patient and updated if appropriate.  Patient Active Problem List   Diagnosis Date Noted  . Hypothyroidism 12/11/2017  . Gynecomastia 09/07/2017  . Prediabetes 05/31/2017  . Allergic rhinitis 05/30/2017  . Asthma 05/30/2017  . Anxiety 05/30/2017  . Palpitations 05/30/2017    Current Outpatient Medications on File Prior to Visit  Medication Sig Dispense Refill  . levothyroxine (SYNTHROID, LEVOTHROID) 50 MCG tablet Take 1 tablet (50 mcg total) by mouth daily. 90 tablet 3   No current facility-administered medications on file prior to visit.     Past Medical History:  Diagnosis Date  . Asthma    As a child  . Chicken pox     Past Surgical History:  Procedure Laterality Date  . HAND SURGERY      Social History   Socioeconomic History  . Marital status: Single    Spouse name: Not on file  . Number of children: 0  . Years of education: 68  . Highest education level: Not on file  Occupational History  . Occupation: Custodian  Social Needs  . Financial resource strain: Not on file  . Food insecurity:    Worry: Not on file    Inability: Not on file  . Transportation needs:    Medical: Not on file    Non-medical: Not on file  Tobacco Use  . Smoking status: Former Smoker    Types: Cigarettes  . Smokeless tobacco: Never Used  Substance and Sexual Activity  . Alcohol use: Yes    Comment: Occasional  . Drug use: Yes    Types: Marijuana    Comment: occasional  . Sexual activity: Not on file  Lifestyle  . Physical activity:    Days per week: Not on file    Minutes per session: Not on file  . Stress: Not on file  Relationships  . Social connections:    Talks on phone: Not on file    Gets together: Not on file    Attends religious service: Not on file   Active member of club or organization: Not on file    Attends meetings of clubs or organizations: Not on file    Relationship status: Not on file  Other Topics Concern  . Not on file  Social History Narrative   Fun/Hobby: Gym, walk, running, swimming    Family History  Problem Relation Age of Onset  . Hypertension Mother   . Asthma Maternal Grandmother   . Thyroid disease Neg Hx     Review of Systems     Objective:  There were no vitals filed for this visit. BP Readings from Last 3 Encounters:  12/11/17 114/84  09/07/17 110/80  05/30/17 106/80   Wt Readings from Last 3 Encounters:  12/11/17 208 lb 12.8 oz (94.7 kg)  09/07/17 217 lb (98.4 kg)  05/30/17 216 lb (98 kg)   There is no height or weight on file to calculate BMI.   Physical Exam         Assessment & Plan:    See Problem List for Assessment and Plan of chronic medical problems.

## 2018-03-02 ENCOUNTER — Ambulatory Visit: Payer: BC Managed Care – PPO | Admitting: Internal Medicine

## 2018-03-02 DIAGNOSIS — Z0289 Encounter for other administrative examinations: Secondary | ICD-10-CM

## 2018-03-05 ENCOUNTER — Other Ambulatory Visit (INDEPENDENT_AMBULATORY_CARE_PROVIDER_SITE_OTHER): Payer: BC Managed Care – PPO

## 2018-03-05 DIAGNOSIS — E039 Hypothyroidism, unspecified: Secondary | ICD-10-CM | POA: Diagnosis not present

## 2018-03-05 DIAGNOSIS — N62 Hypertrophy of breast: Secondary | ICD-10-CM | POA: Diagnosis not present

## 2018-03-05 LAB — TSH: TSH: 3.58 u[IU]/mL (ref 0.35–4.50)

## 2018-03-05 LAB — HCG, QUANTITATIVE, PREGNANCY

## 2018-03-06 LAB — TESTOSTERONE,FREE AND TOTAL
Testosterone, Free: 10.2 pg/mL (ref 8.7–25.1)
Testosterone: 265 ng/dL (ref 264–916)

## 2018-03-14 LAB — ESTRADIOL, FREE
ESTRADIOL FREE: 0.74 pg/mL — AB
ESTRADIOL: 36 pg/mL — AB

## 2018-06-14 ENCOUNTER — Ambulatory Visit: Payer: BC Managed Care – PPO | Admitting: Endocrinology

## 2018-07-10 ENCOUNTER — Other Ambulatory Visit: Payer: Self-pay

## 2018-07-10 ENCOUNTER — Encounter (HOSPITAL_COMMUNITY): Payer: Self-pay | Admitting: Emergency Medicine

## 2018-07-10 ENCOUNTER — Emergency Department (HOSPITAL_COMMUNITY): Payer: BC Managed Care – PPO

## 2018-07-10 ENCOUNTER — Emergency Department (HOSPITAL_COMMUNITY)
Admission: EM | Admit: 2018-07-10 | Discharge: 2018-07-10 | Disposition: A | Discharge status: Home / Self Care | Payer: BC Managed Care – PPO | Attending: Emergency Medicine | Admitting: Emergency Medicine

## 2018-07-10 DIAGNOSIS — E039 Hypothyroidism, unspecified: Secondary | ICD-10-CM | POA: Insufficient documentation

## 2018-07-10 DIAGNOSIS — R05 Cough: Secondary | ICD-10-CM | POA: Diagnosis present

## 2018-07-10 DIAGNOSIS — Z87891 Personal history of nicotine dependence: Secondary | ICD-10-CM | POA: Insufficient documentation

## 2018-07-10 DIAGNOSIS — J45909 Unspecified asthma, uncomplicated: Secondary | ICD-10-CM | POA: Diagnosis not present

## 2018-07-10 DIAGNOSIS — J069 Acute upper respiratory infection, unspecified: Secondary | ICD-10-CM | POA: Diagnosis not present

## 2018-07-10 DIAGNOSIS — B9789 Other viral agents as the cause of diseases classified elsewhere: Secondary | ICD-10-CM

## 2018-07-10 LAB — CBC WITH DIFFERENTIAL/PLATELET
ABS IMMATURE GRANULOCYTES: 0.02 10*3/uL (ref 0.00–0.07)
Basophils Absolute: 0.1 10*3/uL (ref 0.0–0.1)
Basophils Relative: 1 %
Eosinophils Absolute: 0.3 10*3/uL (ref 0.0–0.5)
Eosinophils Relative: 4 %
HCT: 46.1 % (ref 39.0–52.0)
Hemoglobin: 15.2 g/dL (ref 13.0–17.0)
Immature Granulocytes: 0 %
LYMPHS PCT: 39 %
Lymphs Abs: 2.6 10*3/uL (ref 0.7–4.0)
MCH: 30.6 pg (ref 26.0–34.0)
MCHC: 33 g/dL (ref 30.0–36.0)
MCV: 92.9 fL (ref 80.0–100.0)
MONO ABS: 0.7 10*3/uL (ref 0.1–1.0)
MONOS PCT: 10 %
NEUTROS ABS: 3 10*3/uL (ref 1.7–7.7)
Neutrophils Relative %: 46 %
Platelets: 259 10*3/uL (ref 150–400)
RBC: 4.96 MIL/uL (ref 4.22–5.81)
RDW: 12.9 % (ref 11.5–15.5)
WBC: 6.6 10*3/uL (ref 4.0–10.5)
nRBC: 0 % (ref 0.0–0.2)

## 2018-07-10 LAB — BASIC METABOLIC PANEL
ANION GAP: 7 (ref 5–15)
BUN: 17 mg/dL (ref 6–20)
CHLORIDE: 110 mmol/L (ref 98–111)
CO2: 22 mmol/L (ref 22–32)
Calcium: 8.5 mg/dL — ABNORMAL LOW (ref 8.9–10.3)
Creatinine, Ser: 0.9 mg/dL (ref 0.61–1.24)
GFR calc Af Amer: >60 mL/min (ref >60)
GFR calc non Af Amer: >60 mL/min (ref >60)
GLUCOSE: 101 mg/dL — AB (ref 70–99)
POTASSIUM: 3.9 mmol/L (ref 3.5–5.1)
Sodium: 139 mmol/L (ref 135–145)

## 2018-07-10 MED ORDER — BENZONATATE 100 MG PO CAPS: 200.0000 mg | ORAL_CAPSULE | Freq: Three times a day (TID) | ORAL | 0 refills | Status: DC

## 2018-07-10 MED ORDER — ALBUTEROL SULFATE HFA 108 (90 BASE) MCG/ACT IN AERS
1.0000 | INHALATION_SPRAY | Freq: Once | RESPIRATORY_TRACT | Status: AC
  Administered 2018-07-10: 1 via RESPIRATORY_TRACT
  Filled 2018-07-10: qty 6.7

## 2018-07-10 NOTE — Discharge Instructions (Signed)
Return to the ED for worsening symptoms, chest pain, shortness of breath, vomiting or coughing up blood, leg swelling.

## 2018-07-10 NOTE — ED Provider Notes (Signed)
[facility] EMERGENCY DEPARTMENT Provider Note   CSN: [id number] Arrival date & time: 07/10/18  0522    History   Chief Complaint Chief Complaint  Patient presents with  . Shortness of Breath  . Cough    HPI Danny Rubio is a 33 y.o. male with a past medical history of asthma, allergic rhinitis, anxiety, hypothyroidism, who presents to ED for dry cough, congestion, that began last night.  Symptoms got worse this morning.  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  He has not had any medications help with his symptoms.  States that he had improvement with medication given about 2 years ago for similar symptoms after being seen in the ED here.  He denies any recent travel, recent immobilization, chest pain, shortness of breath, fever, vomiting.      HPI  Past Medical History:  Diagnosis Date  . Asthma    As a child  . Chicken pox     Patient Active Problem List   Diagnosis Date Noted  . Hypothyroidism 12/11/2017  . Gynecomastia 09/07/2017  . Prediabetes 05/31/2017  . Allergic rhinitis 05/30/2017  . Asthma 05/30/2017  . Anxiety 05/30/2017  . Palpitations 05/30/2017    Past Surgical History:  Procedure Laterality Date  . HAND SURGERY          Home Medications    Prior to Admission medications   Medication Sig Start Date End Date Taking? Authorizing Provider  benzonatate (TESSALON) 100 MG capsule Take 2 capsules (200 mg total) by mouth every 8 (eight) hours. 07/10/18   Torian Quintero, PA-C  levothyroxine (SYNTHROID, LEVOTHROID) 50 MCG tablet Take 1 tablet (50 mcg total) by mouth daily. Patient not taking: Reported on 07/10/2018 12/11/17   Romero Belling, MD    Family History Family History  Problem Relation Age of Onset  . Hypertension Mother   . Asthma Maternal Grandmother   . Thyroid disease Neg Hx     Social History Social History   Tobacco Use  . Smoking status: Former Smoker    Types:  Cigarettes  . Smokeless tobacco: Never Used  Substance Use Topics  . Alcohol use: Yes    Comment: Occasional  . Drug use: Yes    Types: Marijuana    Comment: occasional     Allergies   Patient has no known allergies.   Review of Systems Review of Systems  Constitutional: Positive for chills. Negative for appetite change and fever.  HENT: Positive for congestion. Negative for ear pain, rhinorrhea, sneezing and sore throat.   Eyes: Negative for photophobia and visual disturbance.  Respiratory: Positive for cough. Negative for chest tightness, shortness of breath and wheezing.   Cardiovascular: Negative for chest pain and palpitations.  Gastrointestinal: Negative for abdominal pain, blood in stool, constipation, diarrhea, nausea and vomiting.  Genitourinary: Negative for dysuria, hematuria and urgency.  Musculoskeletal: Negative for myalgias.  Skin: Negative for rash.  Neurological: Negative for dizziness, weakness and light-headedness.     Physical Exam Updated Vital Signs BP 123/90 (BP Location: Right Arm)   Pulse (!) 56   Temp 97.7 F (36.5 C) (Oral)   Resp 16   Ht 5\' 9"  (1.753 m)   Wt 93.4 kg   SpO2 100%   BMI 30.42 kg/m   Physical Exam Vitals signs and nursing note reviewed.  Constitutional:      General: He is not in acute distress.    Appearance: He is well-developed.  Comments: No acute distress.  Speaking complete sentences without difficulty.  HENT:     Head: Normocephalic and atraumatic.     Nose: Nose normal.     Mouth/Throat:     Pharynx: Oropharynx is clear. Uvula midline.     Comments: Patient does not appear to be in acute distress. No trismus or drooling present. No pooling of secretions. Patient is tolerating secretions and is not in respiratory distress. No neck pain or tenderness to palpation of the neck. Full active and passive range of motion of the neck. No evidence of RPA or PTA. Eyes:     General: No scleral icterus.       Left eye: No  discharge.     Conjunctiva/sclera: Conjunctivae normal.  Neck:     Musculoskeletal: Normal range of motion and neck supple.  Cardiovascular:     Rate and Rhythm: Normal rate and regular rhythm.     Heart sounds: Normal heart sounds. No murmur. No friction rub. No gallop.   Pulmonary:     Effort: Pulmonary effort is normal. No respiratory distress.     Breath sounds: Wheezing (faint, end-expiratory at bases) present.  Abdominal:     General: Bowel sounds are normal. There is no distension.     Palpations: Abdomen is soft.     Tenderness: There is no abdominal tenderness. There is no guarding.  Musculoskeletal: Normal range of motion.     Comments: No lower extremity edema, erythema or calf tenderness bilaterally.  Skin:    General: Skin is warm and dry.     Findings: No rash.  Neurological:     Mental Status: He is alert.     Motor: No abnormal muscle tone.     Coordination: Coordination normal.      ED Treatments / Results  Labs (all labs ordered are listed, but only abnormal results are displayed) Labs Reviewed  BASIC METABOLIC PANEL - Abnormal; Notable for the following components:      Result Value   Glucose, Bld 101 (*)    Calcium 8.5 (*)    All other components within normal limits  CBC WITH DIFFERENTIAL/PLATELET    EKG None  Radiology Dg Chest 2 View  Result Date: 07/10/2018 CLINICAL DATA:  Persistent cough EXAM: CHEST - 2 VIEW COMPARISON:  None. FINDINGS: The heart size and mediastinal contours are within normal limits. Both lungs are clear. The visualized skeletal structures are unremarkable. IMPRESSION: Negative chest. Electronically Signed   By: Marnee Spring M.D.   On: 07/10/2018 06:01    Procedures Procedures (including critical care time)  Medications Ordered in ED Medications  albuterol (PROVENTIL HFA;VENTOLIN HFA) 108 (90 Base) MCG/ACT inhaler 1 puff (has no administration in time range)     Initial Impression / Assessment and Plan / ED Course   I have reviewed the triage vital signs and the nursing notes.  Pertinent labs & imaging results that were available during my care of the patient were reviewed by me and considered in my medical decision making (see chart for details).        33 year old male with past medical history of asthma, allergic rhinitis, anxiety, hypothyroidism presents to ED for dry cough, congestion since last night.  Also reports some chest congestion with similar symptoms in the past.  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  Denies chest pain, shortness of breath, fever or vomiting.  On my exam some faint end expiratory wheezing on bases  without signs of respiratory distress.  Speaking complete sentences without difficulty.  He is not tachycardic, tachypneic or hypoxic.  Chest x-ray is negative for acute abnormality.  CBC, BMP unremarkable.  Suspect that his symptoms are viral in nature due to sick contacts including children.  Will give inhaler, antitussives and have him follow-up with his PCP.  He has no recent travel or known exposures to covid-19, but will advise him to limit contact with chronically ill people until his symptoms resolve.  Advised to return to ED for any severe worsening symptoms.  Patient is hemodynamically stable, in NAD, and able to ambulate in the ED. Evaluation does not show pathology that would require ongoing emergent intervention or inpatient treatment. I explained the diagnosis to the patient. Pain has been managed and has no complaints prior to discharge. Patient is comfortable with above plan and is stable for discharge at this time. All questions were answered prior to disposition. Strict return precautions for returning to the ED were discussed. Encouraged follow up with PCP.    Portions of this note were generated with Scientist, clinical (histocompatibility and immunogenetics). Dictation errors may occur despite best attempts at proofreading.   Final Clinical Impressions(s)  / ED Diagnoses   Final diagnoses:  Viral URI with cough    ED Discharge Orders         Ordered    benzonatate (TESSALON) 100 MG capsule  Every 8 hours     07/10/18 0748           Dietrich Pates, PA-C 07/10/18 0753    Vanetta Mulders, MD 07/10/18 [phone]

## 2018-07-10 NOTE — ED Triage Notes (Signed)
Patient reports persistent productive cough with exertional dyspnea and sore throat onset last week , denies travel history or sick contact with family or co- workers , no fever or chills .

## 2018-09-13 NOTE — Progress Notes (Signed)
Subjective:    Patient ID: Danny Rubio, male    DOB: [birth year], 33 y.o.   MRN: [id number]  HPI He is here for a physical exam.   He stopped the thyroid medication - it caused him to lose hair.  He states he took it for a while.  He was started on this because his TSH was slightly elevated and his testosterone level levels were low-normal.  Endocrine was hoping that this would increase his testosterone levels.  He has not followed up with endocrine.  He is still concerned about his weight and trying to lose weight.  He still has gynecomastia.  He states he has been eating well, but has not been exercising regularly because the gym is closed.  He has been doing some weights at home in abdominal exercises.  He has not been doing cardio.  Medications and allergies reviewed with patient and updated if appropriate.  Patient Active Problem List   Diagnosis Date Noted  . Elevated TSH 12/11/2017  . Gynecomastia 09/07/2017  . Prediabetes 05/31/2017  . Allergic rhinitis 05/30/2017  . Asthma 05/30/2017  . Anxiety 05/30/2017  . Palpitations 05/30/2017    No current outpatient medications on file prior to visit.   No current facility-administered medications on file prior to visit.     Past Medical History:  Diagnosis Date  . Asthma    As a child  . Chicken pox     Past Surgical History:  Procedure Laterality Date  . HAND SURGERY      Social History   Socioeconomic History  . Marital status: Single    Spouse name: Not on file  . Number of children: 0  . Years of education: 10  . Highest education level: Not on file  Occupational History  . Occupation: Custodian  Social Needs  . Financial resource strain: Not on file  . Food insecurity:    Worry: Not on file    Inability: Not on file  . Transportation needs:    Medical: Not on file    Non-medical: Not on file  Tobacco Use  . Smoking status: Former Smoker    Types: Cigarettes  . Smokeless tobacco: Never Used   Substance and Sexual Activity  . Alcohol use: Yes    Comment: Occasional  . Drug use: Yes    Types: Marijuana    Comment: occasional  . Sexual activity: Not on file  Lifestyle  . Physical activity:    Days per week: Not on file    Minutes per session: Not on file  . Stress: Not on file  Relationships  . Social connections:    Talks on phone: Not on file    Gets together: Not on file    Attends religious service: Not on file    Active member of club or organization: Not on file    Attends meetings of clubs or organizations: Not on file    Relationship status: Not on file  Other Topics Concern  . Not on file  Social History Narrative   Fun/Hobby: Gym, walk, running, swimming    Family History  Problem Relation Age of Onset  . Hypertension Mother   . Asthma Maternal Grandmother   . Thyroid disease Neg Hx     Review of Systems  Constitutional: Negative for chills and fever.  Eyes: Negative for visual disturbance.  Respiratory: Negative for cough, shortness of breath and wheezing.   Cardiovascular: Positive for palpitations (with anxiety attacks). Negative for  chest pain.  Gastrointestinal: Negative for abdominal pain, blood in stool, constipation, diarrhea and nausea.       No gerd  Genitourinary: Negative for dysuria and hematuria.  Musculoskeletal: Negative for arthralgias and back pain.  Skin: Negative for color change and rash.  Neurological: Negative for light-headedness and headaches.  Psychiatric/Behavioral: Positive for dysphoric mood ("a little bit sometimes"). The patient is nervous/anxious.        Objective:   Vitals:   09/14/18 1416  BP: 122/86  Pulse: 64  Resp: 16  Temp: 98.2 F (36.8 C)  SpO2: 98%   Filed Weights   09/14/18 1416  Weight: 217 lb 6.4 oz (98.6 kg)   Body mass index is 32.1 kg/m.  Wt Readings from Last 3 Encounters:  09/14/18 217 lb 6.4 oz (98.6 kg)  07/10/18 206 lb (93.4 kg)  12/11/17 208 lb 12.8 oz (94.7 kg)      Physical Exam Constitutional: He appears well-developed and well-nourished. No distress.  HENT:  Head: Normocephalic and atraumatic.  Right Ear: External ear normal.  Left Ear: External ear normal.  Mouth/Throat: Oropharynx is clear and moist.  Normal ear canals and TM b/l  Eyes: Conjunctivae and EOM are normal.  Neck: Neck supple. No tracheal deviation present. No thyromegaly present. No carotid bruit  Cardiovascular: Normal rate, regular rhythm, normal heart sounds and intact distal pulses.  No murmur heard. Pulmonary/Chest: Effort normal and breath sounds normal. No respiratory distress. He has no wheezes. He has no rales.  Abdominal: Soft. He exhibits no distension. There is no tenderness.  Genitourinary: deferred  Musculoskeletal: He exhibits no edema.  Lymphadenopathy:   He has no cervical adenopathy.  Skin: Skin is warm and dry. He is not diaphoretic.  Psychiatric: He has a normal mood and affect. His behavior is normal.         Assessment & Plan:   Physical exam: Screening blood work   ordered Immunizations  Up to date  Exercise - he does weights at home, doing ab exercises-encouraged him to start doing more cardio-walking, running Weight wants to lose weight, he does not eat any sweets.  Discussed with him adding cardio into his workout regimen.  He will go back to the gym when he is able.  Discussed that his weight may also be related to his lower testosterone level, which we will check Skin   No concerns Substance abuse  None, occasional marijuana   See Problem List for Assessment and Plan of chronic medical problems.   Follow-up annually

## 2018-09-13 NOTE — Assessment & Plan Note (Addendum)
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

## 2018-09-13 NOTE — Patient Instructions (Addendum)
Tests ordered today. Your results will be released to MyChart (or called to you) after review, usually within 72hours after test completion. If any changes need to be made, you will be notified at that same time.  All other Health Maintenance issues reviewed.   All recommended immunizations and age-appropriate screenings are up-to-date or discussed.  No immunizations administered today.   Medications reviewed and updated.  Changes include :   none    Please followup in one year    Health Maintenance, Male A healthy lifestyle and preventive care is important for your health and wellness. Ask your health care provider about what schedule of regular examinations is right for you. What should I know about weight and diet? Eat a Healthy Diet  Eat plenty of vegetables, fruits, whole grains, low-fat dairy products, and lean protein.  Do not eat a lot of foods high in solid fats, added sugars, or salt.  Maintain a Healthy Weight Regular exercise can help you achieve or maintain a healthy weight. You should:  Do at least 150 minutes of exercise each week. The exercise should increase your heart rate and make you sweat (moderate-intensity exercise).  Do strength-training exercises at least twice a week. Watch Your Levels of Cholesterol and Blood Lipids  Have your blood tested for lipids and cholesterol every 5 years starting at 35 years of age. If you are at high risk for heart disease, you should start having your blood tested when you are 34 years old. You may need to have your cholesterol levels checked more often if: ? Your lipid or cholesterol levels are high. ? You are older than 34 years of age. ? You are at high risk for heart disease. What should I know about cancer screening? Many types of cancers can be detected early and may often be prevented. Lung Cancer  You should be screened every year for lung cancer if: ? You are a current smoker who has smoked for at least 30  years. ? You are a former smoker who has quit within the past 15 years.  Talk to your health care provider about your screening options, when you should start screening, and how often you should be screened. Colorectal Cancer  Routine colorectal cancer screening usually begins at 34 years of age and should be repeated every 5-10 years until you are 34 years old. You may need to be screened more often if early forms of precancerous polyps or small growths are found. Your health care provider may recommend screening at an earlier age if you have risk factors for colon cancer.  Your health care provider may recommend using home test kits to check for hidden blood in the stool.  A small camera at the end of a tube can be used to examine your colon (sigmoidoscopy or colonoscopy). This checks for the earliest forms of colorectal cancer. Prostate and Testicular Cancer  Depending on your age and overall health, your health care provider may do certain tests to screen for prostate and testicular cancer.  Talk to your health care provider about any symptoms or concerns you have about testicular or prostate cancer. Skin Cancer  Check your skin from head to toe regularly.  Tell your health care provider about any new moles or changes in moles, especially if: ? There is a change in a mole's size, shape, or color. ? You have a mole that is larger than a pencil eraser.  Always use sunscreen. Apply sunscreen liberally and repeat throughout the   day.  Protect yourself by wearing long sleeves, pants, a wide-brimmed hat, and sunglasses when outside. What should I know about heart disease, diabetes, and high blood pressure?  If you are 18-39 years of age, have your blood pressure checked every 3-5 years. If you are 40 years of age or older, have your blood pressure checked every year. You should have your blood pressure measured twice-once when you are at a hospital or clinic, and once when you are not at a  hospital or clinic. Record the average of the two measurements. To check your blood pressure when you are not at a hospital or clinic, you can use: ? An automated blood pressure machine at a pharmacy. ? A home blood pressure monitor.  Talk to your health care provider about your target blood pressure.  If you are between 45-79 years old, ask your health care provider if you should take aspirin to prevent heart disease.  Have regular diabetes screenings by checking your fasting blood sugar level. ? If you are at a normal weight and have a low risk for diabetes, have this test once every three years after the age of 45. ? If you are overweight and have a high risk for diabetes, consider being tested at a younger age or more often.  A one-time screening for abdominal aortic aneurysm (AAA) by ultrasound is recommended for men aged 65-75 years who are current or former smokers. What should I know about preventing infection? Hepatitis B If you have a higher risk for hepatitis B, you should be screened for this virus. Talk with your health care provider to find out if you are at risk for hepatitis B infection. Hepatitis C Blood testing is recommended for:  Everyone born from 1945 through 1965.  Anyone with known risk factors for hepatitis C. Sexually Transmitted Diseases (STDs)  You should be screened each year for STDs including gonorrhea and chlamydia if: ? You are sexually active and are younger than 34 years of age. ? You are older than 34 years of age and your health care provider tells you that you are at risk for this type of infection. ? Your sexual activity has changed since you were last screened and you are at an increased risk for chlamydia or gonorrhea. Ask your health care provider if you are at risk.  Talk with your health care provider about whether you are at high risk of being infected with HIV. Your health care provider may recommend a prescription medicine to help prevent  HIV infection. What else can I do?  Schedule regular health, dental, and eye exams.  Stay current with your vaccines (immunizations).  Do not use any tobacco products, such as cigarettes, chewing tobacco, and e-cigarettes. If you need help quitting, ask your health care provider.  Limit alcohol intake to no more than 2 drinks per day. One drink equals 12 ounces of beer, 5 ounces of wine, or 1 ounces of hard liquor.  Do not use street drugs.  Do not share needles.  Ask your health care provider for help if you need support or information about quitting drugs.  Tell your health care provider if you often feel depressed.  Tell your health care provider if you have ever been abused or do not feel safe at home. This information is not intended to replace advice given to you by your health care provider. Make sure you discuss any questions you have with your health care provider. Document Released: 10/08/2007 Document Revised:   12/09/2015 Document Reviewed: 01/13/2015 Elsevier Interactive Patient Education  2019 [employer]  

## 2018-09-14 ENCOUNTER — Ambulatory Visit (INDEPENDENT_AMBULATORY_CARE_PROVIDER_SITE_OTHER): Payer: BC Managed Care – PPO | Admitting: Internal Medicine

## 2018-09-14 ENCOUNTER — Encounter: Payer: Self-pay | Admitting: Internal Medicine

## 2018-09-14 ENCOUNTER — Other Ambulatory Visit (INDEPENDENT_AMBULATORY_CARE_PROVIDER_SITE_OTHER): Payer: BC Managed Care – PPO

## 2018-09-14 ENCOUNTER — Other Ambulatory Visit: Payer: Self-pay

## 2018-09-14 VITALS — BP 122/86 | HR 64 | Temp 98.2°F | Resp 16 | Ht 69.0 in | Wt 217.4 lb

## 2018-09-14 DIAGNOSIS — R7989 Other specified abnormal findings of blood chemistry: Secondary | ICD-10-CM

## 2018-09-14 DIAGNOSIS — R7303 Prediabetes: Secondary | ICD-10-CM

## 2018-09-14 DIAGNOSIS — N62 Hypertrophy of breast: Secondary | ICD-10-CM | POA: Diagnosis not present

## 2018-09-14 DIAGNOSIS — Z0000 Encounter for general adult medical examination without abnormal findings: Secondary | ICD-10-CM | POA: Diagnosis not present

## 2018-09-14 LAB — LIPID PANEL
Cholesterol: 176 mg/dL (ref 0–200)
HDL: 33.9 mg/dL — ABNORMAL LOW
LDL Cholesterol: 121 mg/dL — ABNORMAL HIGH (ref 0–99)
NonHDL: 142.58
Total CHOL/HDL Ratio: 5
Triglycerides: 106 mg/dL (ref 0.0–149.0)
VLDL: 21.2 mg/dL (ref 0.0–40.0)

## 2018-09-14 LAB — COMPREHENSIVE METABOLIC PANEL WITH GFR
ALT: 32 U/L (ref 0–53)
AST: 19 U/L (ref 0–37)
Albumin: 3.9 g/dL (ref 3.5–5.2)
Alkaline Phosphatase: 46 U/L (ref 39–117)
BUN: 10 mg/dL (ref 6–23)
CO2: 27 meq/L (ref 19–32)
Calcium: 8.9 mg/dL (ref 8.4–10.5)
Chloride: 108 meq/L (ref 96–112)
Creatinine, Ser: 0.85 mg/dL (ref 0.40–1.50)
GFR: 124.7 mL/min
Glucose, Bld: 90 mg/dL (ref 70–99)
Potassium: 3.9 meq/L (ref 3.5–5.1)
Sodium: 141 meq/L (ref 135–145)
Total Bilirubin: 0.4 mg/dL (ref 0.2–1.2)
Total Protein: 6.4 g/dL (ref 6.0–8.3)

## 2018-09-14 LAB — CBC WITH DIFFERENTIAL/PLATELET
Basophils Absolute: 0.1 K/uL (ref 0.0–0.1)
Basophils Relative: 1.4 % (ref 0.0–3.0)
Eosinophils Absolute: 0.1 K/uL (ref 0.0–0.7)
Eosinophils Relative: 2.8 % (ref 0.0–5.0)
HCT: 47.1 % (ref 39.0–52.0)
Hemoglobin: 16 g/dL (ref 13.0–17.0)
Lymphocytes Relative: 37.6 % (ref 12.0–46.0)
Lymphs Abs: 1.8 K/uL (ref 0.7–4.0)
MCHC: 33.9 g/dL (ref 30.0–36.0)
MCV: 91.8 fl (ref 78.0–100.0)
Monocytes Absolute: 0.3 K/uL (ref 0.1–1.0)
Monocytes Relative: 6.3 % (ref 3.0–12.0)
Neutro Abs: 2.5 K/uL (ref 1.4–7.7)
Neutrophils Relative %: 51.9 % (ref 43.0–77.0)
Platelets: 243 K/uL (ref 150.0–400.0)
RBC: 5.13 Mil/uL (ref 4.22–5.81)
RDW: 13.3 % (ref 11.5–15.5)
WBC: 4.9 K/uL (ref 4.0–10.5)

## 2018-09-14 LAB — HEMOGLOBIN A1C: Hgb A1c MFr Bld: 5.7 % (ref 4.6–6.5)

## 2018-09-14 LAB — TSH: TSH: 3.12 u[IU]/mL (ref 0.35–4.50)

## 2018-09-14 NOTE — Assessment & Plan Note (Signed)
Was placed on levothyroxine, but he stopped taking it because it did cause him to lose his hair tsh

## 2018-09-14 NOTE — Assessment & Plan Note (Signed)
Check a1c Low sugar / carb diet Stressed regular exercise   

## 2018-09-14 NOTE — Assessment & Plan Note (Signed)
Testosterone levels Consider urology referral or have him go back to endocrine

## 2018-09-19 ENCOUNTER — Encounter: Payer: Self-pay | Admitting: Internal Medicine

## 2018-09-19 ENCOUNTER — Other Ambulatory Visit: Payer: Self-pay

## 2018-09-19 ENCOUNTER — Ambulatory Visit (INDEPENDENT_AMBULATORY_CARE_PROVIDER_SITE_OTHER): Payer: BC Managed Care – PPO | Admitting: Internal Medicine

## 2018-09-19 VITALS — BP 118/76 | HR 79 | Temp 98.3°F | Resp 16 | Ht 69.0 in | Wt 217.0 lb

## 2018-09-19 DIAGNOSIS — R7989 Other specified abnormal findings of blood chemistry: Secondary | ICD-10-CM

## 2018-09-19 DIAGNOSIS — F3289 Other specified depressive episodes: Secondary | ICD-10-CM

## 2018-09-19 DIAGNOSIS — F329 Major depressive disorder, single episode, unspecified: Secondary | ICD-10-CM | POA: Insufficient documentation

## 2018-09-19 DIAGNOSIS — F32A Depression, unspecified: Secondary | ICD-10-CM | POA: Insufficient documentation

## 2018-09-19 LAB — TESTOSTERONE, FREE & TOTAL
Free Testosterone: 58.6 pg/mL (ref 35.0–155.0)
Testosterone, Total, LC-MS-MS: 329 ng/dL (ref 250–1100)

## 2018-09-19 NOTE — Progress Notes (Signed)
Subjective:    Patient ID: Danny Rubio, male    DOB: [birth year], 33 y.o.   MRN: [id number]  HPI The patient is here for an acute visit.   He feels depressed.  His grandmother died in 05/19/22.  He does not think that he is dealt with that and he still struggles with it.  He also does not like 1 of his jobs.  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  His part-time job is fine, but he is not happy with his full-time job.  This is causing some depression.  He does not take any medication.  He has thought about talking to someone.  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  He has not been exercising as he should, but is having difficulty losing weight.  He does have gynecomastia.  His estrogen levels were slightly elevated in the past.  His TSH was slightly elevated and he did see endocrine who placed him on levothyroxine hoping that that would also in turn increase his testosterone level.  He did not tolerate levothyroxine-made his hair fall out and he just stopped it.  He states lower energy level action and he has prediabetes.  He would consider testosterone replacement if it was needed because he is tired of feeling like he is feeling.    Medications and allergies reviewed with patient and updated if appropriate.  Patient Active Problem List   Diagnosis Date Noted  . 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 Elevated TSH 12/11/2017  . Gynecomastia 09/07/2017  . Prediabetes 05/31/2017  . Allergic rhinitis 05/30/2017  . Asthma 05/30/2017  . Anxiety 05/30/2017  . Palpitations 05/30/2017    No current outpatient medications on file prior to visit.   No current facility-administered medications on file prior to visit.     Past Medical History:  Diagnosis Date  . Asthma    As a child  . Chicken pox     Past Surgical History:  Procedure Laterality Date  . HAND SURGERY       Social History   Socioeconomic History  . Marital status: Single    Spouse name: Not on file  . Number of children: 0  . Years of education: 56  . Highest education level: Not on file  Occupational History  . Occupation: Custodian  Social Needs  . Financial resource strain: Not on file  . Food insecurity:    Worry: Not on file    Inability: Not on file  . Transportation needs:    Medical: Not on file    Non-medical: Not on file  Tobacco Use  . Smoking status: Former Smoker    Types: Cigarettes  . Smokeless tobacco: Never Used  Substance and Sexual Activity  . Alcohol use: Yes    Comment: Occasional  . Drug use: Yes    Types: Marijuana    Comment: occasional  . Sexual activity: Not on file  Lifestyle  . Physical activity:    Days per week: Not on file    Minutes per session: Not on file  . Stress: Not on file  Relationships  . Social connections:    Talks on phone: Not on file    Gets together: Not on file    Attends religious service: Not on file    Active member of club or organization: Not on file    Attends meetings of clubs  or organizations: Not on file    Relationship status: Not on file  Other Topics Concern  . Not on file  Social History Narrative   Fun/Hobby: Gym, walk, running, swimming    Family History  Problem Relation Age of Onset  . Hypertension Mother   . Asthma Maternal Grandmother   . Thyroid disease Neg Hx     Review of Systems  Constitutional: Positive for fatigue (Mild).       Difficulty losing weight  Endocrine:       Gynecomastia  Psychiatric/Behavioral: Positive for dysphoric mood.       Objective:   Vitals:   09/19/18 1535  BP: 118/76  Pulse: 79  Resp: 16  Temp: 98.3 F (36.8 C)  SpO2: 98%   BP Readings from Last 3 Encounters:  09/19/18 118/76  09/14/18 122/86  07/10/18 123/90   Wt Readings from Last 3 Encounters:  09/19/18 217 lb (98.4 kg)  09/14/18 217 lb 6.4 oz (98.6 kg)  07/10/18 206 lb (93.4 kg)   Body  mass index is 32.05 kg/m.   Physical Exam Constitutional:      General: He is not in acute distress.    Appearance: Normal appearance. He is not ill-appearing.  HENT:     Head: Normocephalic and atraumatic.  Skin:    General: Skin is warm and dry.  Neurological:     Mental Status: He is alert.  Psychiatric:        Behavior: Behavior normal.        Thought Content: Thought content normal.        Judgment: Judgment normal.     Comments: Dysphoric mood            Assessment & Plan:    See Problem List for Assessment and Plan of chronic medical problems.

## 2018-09-19 NOTE — Assessment & Plan Note (Signed)
Last year he did have low testosterone level and it was taken in the morning, but his testosterone level done last week was in the afternoon so it is less reliable He does have low testosterone level and asymptomatic He did see endocrine and they tried him on levothyroxine, which he had side effects to when he discontinued-recent TSH within normal limits He would consider seeing urology and would consider testosterone replacement if it would help with the symptoms Referral to urology ordered

## 2018-09-19 NOTE — Assessment & Plan Note (Signed)
Feels he is depressed Some of its related to his current job that he does not like and some of its related to his grandmother passing away a few months ago He does not want to take any medication Would maybe consider speaking to someone if he continues to feel this way, but if the testosterone replacement would help him feel better and then holding off and speaking to someone He is trying to find a new job and that will likely help some of his depression is well Names and numbers given of therapist so that he can contact someone if he wishes He changes his mind about taking an antidepressant he will let me know

## 2018-09-19 NOTE — Patient Instructions (Addendum)
We will call you with the results of your testosterone level.    A referral was ordered for urology to consider testosterone replacement.  They will call you to schedule an appointment.    If you want to talk to a therapist here are some - call and schedule an appointment.    [facility] [phone]  [facility]  [address] [place], [place] [phone]  [facility], [facility] Office [address], [place], [place], [address] [phone] Office [phone] [facility]            [address] [place], [place] [phone]

## 2019-01-24 ENCOUNTER — Encounter (HOSPITAL_COMMUNITY): Payer: Self-pay | Admitting: Emergency Medicine

## 2019-01-24 ENCOUNTER — Emergency Department (HOSPITAL_COMMUNITY)
Admission: EM | Admit: 2019-01-24 | Discharge: 2019-01-24 | Disposition: A | Discharge status: Home / Self Care | Payer: BC Managed Care – PPO | Attending: Emergency Medicine | Admitting: Emergency Medicine

## 2019-01-24 DIAGNOSIS — R070 Pain in throat: Secondary | ICD-10-CM | POA: Insufficient documentation

## 2019-01-24 DIAGNOSIS — J029 Acute pharyngitis, unspecified: Secondary | ICD-10-CM

## 2019-01-24 DIAGNOSIS — Z87891 Personal history of nicotine dependence: Secondary | ICD-10-CM | POA: Insufficient documentation

## 2019-01-24 DIAGNOSIS — J45909 Unspecified asthma, uncomplicated: Secondary | ICD-10-CM | POA: Insufficient documentation

## 2019-01-24 MED ORDER — LIDOCAINE VISCOUS HCL 2 % MT SOLN
15.0000 mL | Freq: Once | OROMUCOSAL | Status: AC
  Administered 2019-01-24: 15 mL via ORAL
  Filled 2019-01-24: qty 15

## 2019-01-24 MED ORDER — FAMOTIDINE 20 MG PO TABS: 20.0000 mg | ORAL_TABLET | Freq: Once | ORAL | Status: DC

## 2019-01-24 MED ORDER — FAMOTIDINE 20 MG PO TABS: 20.0000 mg | ORAL_TABLET | Freq: Two times a day (BID) | ORAL | 0 refills | Status: DC

## 2019-01-24 MED ORDER — LIDOCAINE VISCOUS HCL 2 % MT SOLN: 15.0000 mL | Freq: Once | OROMUCOSAL | Status: DC

## 2019-01-24 MED ORDER — ALUM & MAG HYDROXIDE-SIMETH 200-200-20 MG/5ML PO SUSP
30.0000 mL | Freq: Once | ORAL | Status: AC
  Administered 2019-01-24: 30 mL via ORAL
  Filled 2019-01-24: qty 30

## 2019-01-24 MED ORDER — DIPHENHYDRAMINE HCL 25 MG PO TABS: 25.0000 mg | ORAL_TABLET | Freq: Four times a day (QID) | ORAL | 0 refills | Status: DC | PRN

## 2019-01-24 NOTE — ED Triage Notes (Signed)
Pt reports 2 days of sore throat. Denies recent fever.

## 2019-01-24 NOTE — ED Notes (Signed)
No answer when called for vitals/triage

## 2019-01-24 NOTE — Discharge Instructions (Signed)
Please take Pepcid twice a day for one week Take Benadryl as needed for itching and swelling Return if worsening

## 2019-01-24 NOTE — ED Provider Notes (Signed)
[facility] EMERGENCY DEPARTMENT Provider Note   CSN: [id number] Arrival date & time: 01/24/19  6295     History   Chief Complaint Chief Complaint  Patient presents with  . Sore Throat    HPI Danny Rubio is a 33 y.o. male who presents with a sore throat. PMH significant for prediabetes, allergies, asthma. He states that about 2 days ago he started to have an irritated throat. It has gradually worsened and now it is constant and worse with swallowing. He denies any other symptoms - no fever, chills, body aches, cough, runny nose, sneezing, chest pain or SOB. He tried OTC medicine for sore throat and it didn't help. Pain is worse with lying down. 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 He feels it is swollen and painful. No known sick contacts.     HPI  Past Medical History:  Diagnosis Date  . Asthma    As a child  . Chicken pox     Patient Active Problem List   Diagnosis Date Noted  . Depression 09/19/2018  . 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 Elevated TSH 12/11/2017  . Gynecomastia 09/07/2017  . Prediabetes 05/31/2017  . Allergic rhinitis 05/30/2017  . Asthma 05/30/2017  . Anxiety 05/30/2017  . Palpitations 05/30/2017    Past Surgical History:  Procedure Laterality Date  . HAND SURGERY          Home Medications    Prior to Admission medications   Not on File    Family History Family History  Problem Relation Age of Onset  . Hypertension Mother   . Asthma Maternal Grandmother   . Thyroid disease Neg Hx     Social History Social History   Tobacco Use  . Smoking status: Former Smoker    Types: Cigarettes  . Smokeless tobacco: Never Used  Substance Use Topics  . Alcohol use: Yes    Comment: Occasional  . Drug use: Never     Allergies   Patient has no known allergies.   Review of Systems Review of Systems  Constitutional: Negative for  fever.  HENT: Positive for sore throat. Negative for congestion, ear pain and rhinorrhea.   Respiratory: Negative for cough.      Physical Exam Updated Vital Signs BP 132/82 (BP Location: Left Arm)   Pulse 75   Temp 98.5 F (36.9 C) (Oral)   Resp 16   Ht 5\' 9"  (1.753 m)   Wt 95.3 kg   SpO2 99%   BMI 31.01 kg/m   Physical Exam Vitals signs and nursing note reviewed.  Constitutional:      General: He is not in acute distress.    Appearance: He is well-developed. He is not ill-appearing.  HENT:     Head: Normocephalic and atraumatic.     Right Ear: Tympanic membrane normal.     Left Ear: Tympanic membrane normal.     Nose: Nose normal.     Mouth/Throat:     Lips: Pink.     Mouth: Mucous membranes are moist.     Pharynx: Uvula midline. Posterior oropharyngeal erythema present. No pharyngeal swelling, oropharyngeal exudate or uvula swelling.     Tonsils: No tonsillar exudate or tonsillar abscesses.  Eyes:     General: No scleral icterus.       Right eye: No discharge.        Left eye:  No discharge.     Conjunctiva/sclera: Conjunctivae normal.     Pupils: Pupils are equal, round, and reactive to light.  Neck:     Musculoskeletal: Normal range of motion.  Cardiovascular:     Rate and Rhythm: Normal rate.  Pulmonary:     Effort: Pulmonary effort is normal. No respiratory distress.  Abdominal:     General: There is no distension.  Skin:    General: Skin is warm and dry.  Neurological:     Mental Status: He is alert and oriented to person, place, and time.  Psychiatric:        Behavior: Behavior normal.      ED Treatments / Results  Labs (all labs ordered are listed, but only abnormal results are displayed) Labs Reviewed - No data to display  EKG None  Radiology No results found.  Procedures Procedures (including critical care time)  Medications Ordered in ED Medications  alum & mag hydroxide-simeth (MAALOX/MYLANTA) 200-200-20 MG/5ML suspension 30 mL  (has no administration in time range)    And  lidocaine (XYLOCAINE) 2 % viscous mouth solution 15 mL (has no administration in time range)     Initial Impression / Assessment and Plan / ED Course  I have reviewed the triage vital signs and the nursing notes.  Pertinent labs & imaging results that were available during my care of the patient were reviewed by me and considered in my medical decision making (see chart for details).  33 year old with isolated sore throat for 2 days. Possibly infectious or inflammatory. HEENT is overall unremarkable other than mild erythema. Per [facility] criteria he has a score of 1. Shared decision making was made regarding testing - he is comfortable not doing this today and states it doesn't feel like strep throat that he's had in the past. Will treat symptomatically with mylanta and lidocaine today. Will rx Pepcid and Benadryl for possible GERD vs allergic reaction from inhaled chemicals. Return precautions given.  Final Clinical Impressions(s) / ED Diagnoses   Final diagnoses:  Sore throat    ED Discharge Orders    None       Bethel Born, PA-C 01/24/19 1041    Benjiman Core, MD 01/24/19 1555

## 2019-02-26 ENCOUNTER — Other Ambulatory Visit: Payer: Self-pay

## 2019-02-26 DIAGNOSIS — Z20822 Contact with and (suspected) exposure to covid-19: Secondary | ICD-10-CM

## 2019-02-27 LAB — NOVEL CORONAVIRUS, NAA: SARS-CoV-2, NAA: NOT DETECTED
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
## 2019-12-24 ENCOUNTER — Other Ambulatory Visit: Payer: Self-pay

## 2019-12-24 ENCOUNTER — Emergency Department (HOSPITAL_COMMUNITY)
Admission: EM | Admit: 2019-12-24 | Discharge: 2019-12-24 | Discharge status: Absent without leave | Payer: BC Managed Care – PPO

## 2021-04-14 ENCOUNTER — Emergency Department (HOSPITAL_BASED_OUTPATIENT_CLINIC_OR_DEPARTMENT_OTHER)
Admission: EM | Admit: 2021-04-14 | Discharge: 2021-04-14 | Disposition: A | Discharge status: Home / Self Care | Payer: Self-pay | Attending: Emergency Medicine | Admitting: Emergency Medicine

## 2021-04-14 ENCOUNTER — Encounter (HOSPITAL_BASED_OUTPATIENT_CLINIC_OR_DEPARTMENT_OTHER): Payer: Self-pay

## 2021-04-14 ENCOUNTER — Other Ambulatory Visit: Payer: Self-pay

## 2021-04-14 DIAGNOSIS — H6123 Impacted cerumen, bilateral: Secondary | ICD-10-CM | POA: Insufficient documentation

## 2021-04-14 DIAGNOSIS — Z87891 Personal history of nicotine dependence: Secondary | ICD-10-CM | POA: Insufficient documentation

## 2021-04-14 DIAGNOSIS — Z20822 Contact with and (suspected) exposure to covid-19: Secondary | ICD-10-CM | POA: Insufficient documentation

## 2021-04-14 DIAGNOSIS — J45909 Unspecified asthma, uncomplicated: Secondary | ICD-10-CM | POA: Insufficient documentation

## 2021-04-14 DIAGNOSIS — J069 Acute upper respiratory infection, unspecified: Secondary | ICD-10-CM | POA: Insufficient documentation

## 2021-04-14 LAB — RESP PANEL BY RT-PCR (FLU A&B, COVID) ARPGX2
Influenza A by PCR: NEGATIVE
Influenza B by PCR: NEGATIVE
SARS Coronavirus 2 by RT PCR: NEGATIVE

## 2021-04-14 LAB — GROUP A STREP BY PCR: Group A Strep by PCR: NOT DETECTED

## 2021-04-14 NOTE — Discharge Instructions (Signed)

## 2021-04-14 NOTE — ED Triage Notes (Signed)
Patient here POV from Home with Flu-Like Symptoms.  Patient has had a Cough and Sore Throat for approximately 2-3 days. No Fevers. No Body Aches  NAD Noted during Triage. A&Ox4. GCS 15. Ambulatory.

## 2021-04-14 NOTE — ED Notes (Signed)
RN provided AVS using Teachback Method. Patient verbalizes understanding of Discharge Instructions. Opportunity for Questioning and Answers were provided by RN. Patient Discharged from ED ambulatory to Home via Self.  

## 2021-04-14 NOTE — ED Provider Notes (Signed)
[facility] EMERGENCY DEPT Provider Note   CSN: [id number] Arrival date & time: 04/14/21  1234     History Chief Complaint  Patient presents with   Cough    Danny Rubio is a 35 y.o. male. Patient presents with cough and sore throat for 2 days.  Says cough is productive with yellow sputum.  Also complains of sore throat, however this is improved over the past couple days.  He needs a COVID and flu test for work.  Denies any fevers, body aches, chills, shortness of breath, chest pain, nasal congestion.   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   Elevated TSH 12/11/2017   Gynecomastia 09/07/2017   Prediabetes 05/31/2017   Allergic rhinitis 05/30/2017   Asthma 05/30/2017   Anxiety 05/30/2017   Palpitations 05/30/2017    Past Surgical History:  Procedure Laterality Date   HAND SURGERY         Family History  Problem Relation Age of Onset   Hypertension Mother    Asthma Maternal Grandmother    Thyroid disease Neg Hx     Social History   Tobacco Use   Smoking status: Former    Types: Cigarettes   Smokeless tobacco: Never  Vaping Use   Vaping Use: Never used  Substance Use Topics   Alcohol use: Yes    Comment: Occasional   Drug use: Never    Home Medications Prior to Admission medications   Medication Sig Start Date End Date Taking? Authorizing Provider  diphenhydrAMINE (BENADRYL) 25 MG tablet Take 1 tablet (25 mg total) by mouth every 6 (six) hours as needed. 01/24/19   Bethel Born, PA-C  famotidine (PEPCID) 20 MG tablet Take 1 tablet (20 mg total) by mouth 2 (two) times daily. 01/24/19   Bethel Born, PA-C    Allergies    Patient has no known allergies.  Review of Systems   Review of Systems  HENT:  Positive for sore  throat.   Respiratory:  Positive for cough.   All other systems reviewed and are negative.  Physical Exam Updated Vital Signs BP (!) 131/92 (BP Location: Right Arm)    Pulse 64    Temp 98 F (36.7 C) (Oral)    Resp 16    Ht 5\' 9"  (1.753 m)    Wt 98.4 kg    SpO2 100%    BMI 32.05 kg/m   Physical Exam Vitals and nursing note reviewed.  Constitutional:      General: He is not in acute distress.    Appearance: Normal appearance. He is well-developed. He is not ill-appearing, toxic-appearing or diaphoretic.  HENT:     Head: Normocephalic and atraumatic.     Comments: No sinus tenderness to palpation    Right Ear: Tympanic membrane, ear canal and external ear normal. There is impacted cerumen. No mastoid tenderness. Tympanic membrane is not perforated, erythematous, retracted or bulging.     Left Ear: Tympanic membrane, ear canal and external ear normal. There is impacted cerumen. No mastoid tenderness. Tympanic membrane is not perforated, erythematous, retracted or bulging.     Nose: Nose normal. No nasal deformity, congestion or rhinorrhea.     Right Sinus: No maxillary sinus tenderness  or frontal sinus tenderness.     Left Sinus: No maxillary sinus tenderness or frontal sinus tenderness.     Mouth/Throat:     Lips: Pink. No lesions.     Mouth: Mucous membranes are moist. No injury, lacerations, oral lesions or angioedema.     Pharynx: Oropharynx is clear. Uvula midline. No pharyngeal swelling, oropharyngeal exudate, posterior oropharyngeal erythema or uvula swelling.     Tonsils: No tonsillar exudate or tonsillar abscesses.  Eyes:     General: Gaze aligned appropriately. No scleral icterus.       Right eye: No discharge.        Left eye: No discharge.     Conjunctiva/sclera: Conjunctivae normal.     Right eye: Right conjunctiva is not injected. No exudate or hemorrhage.    Left eye: Left conjunctiva is not injected. No exudate or hemorrhage. Cardiovascular:     Rate and Rhythm:  Normal rate and regular rhythm.     Heart sounds: Normal heart sounds. Heart sounds not distant. No murmur heard.   No friction rub. No gallop. No S3 or S4 sounds.  Pulmonary:     Effort: Pulmonary effort is normal. No respiratory distress.     Breath sounds: Normal breath sounds. No stridor, decreased air movement or transmitted upper airway sounds. No wheezing, rhonchi or rales.  Musculoskeletal:     Right lower leg: No edema.     Left lower leg: No edema.  Skin:    General: Skin is warm and dry.  Neurological:     Mental Status: He is alert and oriented to person, place, and time.  Psychiatric:        Mood and Affect: Mood normal.        Speech: Speech normal.        Behavior: Behavior normal. Behavior is cooperative.    ED Results / Procedures / Treatments   Labs (all labs ordered are listed, but only abnormal results are displayed) Labs Reviewed  RESP PANEL BY RT-PCR (FLU A&B, COVID) ARPGX2  GROUP A STREP BY PCR    EKG None  Radiology No results found.  Procedures Procedures   Medications Ordered in ED Medications - No data to display  ED Course  I have reviewed the triage vital signs and the nursing notes.  Pertinent labs & imaging results that were available during my care of the patient were reviewed by me and considered in my medical decision making (see chart for details).    MDM Rules/Calculators/A&P                          This is a well-appearing patient presents with upper respiratory symptoms for 2 days. His vitals are stable He overall appears well.  No signs of tonsillar swelling or exudate noted in posterior pharynx.  Ears without evidence of otitis media.  Lymphadenopathy.  Lung sounds clear to auscultation bilaterally.  Patient with pending strep test that was ordered in triage.  Also with pending COVID flu testing.  1347: Strep, COVID, and flu testing negative. Patient still appears to be in no distress.   Likely upper respiratory  infection.  Will treat symptomatically at home.  Final Clinical Impression(s) / ED Diagnoses Final diagnoses:  Viral upper respiratory tract infection    Rx / DC Orders ED Discharge Orders     None        Claudie Leach, PA-C 04/14/21 1348    Long, Arlyss Repress,  MD 04/15/21 [phone]

## 2021-05-03 ENCOUNTER — Emergency Department (HOSPITAL_BASED_OUTPATIENT_CLINIC_OR_DEPARTMENT_OTHER)
Admission: EM | Admit: 2021-05-03 | Discharge: 2021-05-03 | Disposition: A | Discharge status: Home / Self Care | Payer: BC Managed Care – PPO | Attending: Emergency Medicine | Admitting: Emergency Medicine

## 2021-05-03 ENCOUNTER — Encounter (HOSPITAL_BASED_OUTPATIENT_CLINIC_OR_DEPARTMENT_OTHER): Payer: Self-pay | Admitting: Emergency Medicine

## 2021-05-03 ENCOUNTER — Other Ambulatory Visit: Payer: Self-pay

## 2021-05-03 DIAGNOSIS — J45909 Unspecified asthma, uncomplicated: Secondary | ICD-10-CM | POA: Insufficient documentation

## 2021-05-03 DIAGNOSIS — U071 COVID-19: Secondary | ICD-10-CM | POA: Insufficient documentation

## 2021-05-03 DIAGNOSIS — J029 Acute pharyngitis, unspecified: Secondary | ICD-10-CM | POA: Diagnosis present

## 2021-05-03 LAB — RESP PANEL BY RT-PCR (FLU A&B, COVID) ARPGX2
Influenza A by PCR: NEGATIVE
Influenza B by PCR: NEGATIVE
SARS Coronavirus 2 by RT PCR: POSITIVE — AB

## 2021-05-03 LAB — GROUP A STREP BY PCR: Group A Strep by PCR: NOT DETECTED

## 2021-05-03 MED ORDER — FLUTICASONE PROPIONATE 50 MCG/ACT NA SUSP: 2.0000 | Freq: Every day | NASAL | 0 refills | Status: DC

## 2021-05-03 MED ORDER — BENZONATATE 100 MG PO CAPS: 100.0000 mg | ORAL_CAPSULE | Freq: Three times a day (TID) | ORAL | 0 refills | Status: DC | PRN

## 2021-05-03 MED ORDER — IBUPROFEN 800 MG PO TABS: 800.0000 mg | ORAL_TABLET | Freq: Three times a day (TID) | ORAL | 0 refills | Status: DC | PRN

## 2021-05-03 NOTE — Discharge Instructions (Addendum)
You are seen in the emergency room today with sore throat and congestion.  You have been diagnosed with COVID-19.  Please remain in isolation for at least the next 5 days.  If you do not have fever and you are feeling better you can come out of quarantine at that time but must wear a mask for at least another 5 days.  I have called in some medications to your pharmacy to help with symptoms.  Please return with any new or suddenly worsening symptoms such as chest pain or shortness of breath.

## 2021-05-03 NOTE — ED Provider Notes (Signed)
Emergency Department Provider Note   I have reviewed the triage vital signs and the nursing notes.   HISTORY  Chief Complaint Sore Throat   HPI Socorro Garbers is a 36 y.o. male with PMH of asthma presents to the emergency department for evaluation of sore throat.  Symptoms began yesterday.  He is not having any change in voice, inability to swallow, fever.  He does have some pain with swallowing.  He is able to drink fluids without difficulty.  No sick contacts.  No cough or congestion symptoms.   Past Medical History:  Diagnosis Date   Asthma    As a child   Chicken pox     Review of Systems  Constitutional: No fever/chills ENT: Positive sore throat. Cardiovascular: Denies chest pain. Respiratory: Denies shortness of breath. Gastrointestinal: No abdominal pain.  No nausea, no vomiting.  Skin: Negative for rash. Neurological: Negative for headaches.   ____________________________________________   PHYSICAL EXAM:  VITAL SIGNS: ED Triage Vitals  Enc Vitals Group     BP 05/03/21 1059 133/77     Pulse Rate 05/03/21 1059 73     Resp 05/03/21 1059 16     Temp 05/03/21 1059 99.2 F (37.3 C)     Temp Source 05/03/21 1059 Oral     SpO2 05/03/21 1059 100 %     Weight 05/03/21 1104 215 lb (97.5 kg)     Height 05/03/21 1104 5\' 9"  (1.753 m)   Constitutional: Alert and oriented. Well appearing and in no acute distress. Eyes: Conjunctivae are normal.  Head: Atraumatic. Nose: No congestion/rhinnorhea. Mouth/Throat: Mucous membranes are moist.  Oropharynx with mild erythema. No exudate. No PTA. Clear voice. Managing oral secretions.  Neck: No stridor.   Cardiovascular: Normal rate, regular rhythm. Good peripheral circulation. Grossly normal heart sounds.   Respiratory: Normal respiratory effort.  No retractions. Lungs CTAB. Gastrointestinal: No distention.  Musculoskeletal: No gross deformities of extremities. Neurologic:  Normal speech and language.  Skin:  Skin is  warm, dry and intact. No rash noted.  ____________________________________________   LABS (all labs ordered are listed, but only abnormal results are displayed)  Labs Reviewed  RESP PANEL BY RT-PCR (FLU A&B, COVID) ARPGX2 - Abnormal; Notable for the following components:      Result Value   SARS Coronavirus 2 by RT PCR POSITIVE (*)    All other components within normal limits  GROUP A STREP BY PCR    ____________________________________________   PROCEDURES  Procedure(s) performed:   Procedures  None  ____________________________________________   INITIAL IMPRESSION / ASSESSMENT AND PLAN / ED COURSE  Pertinent labs & imaging results that were available during my care of the patient were reviewed by me and considered in my medical decision making (see chart for details).   This patient is Presenting for Evaluation of Sore Throat, which does require a range of treatment options, and is a complaint that involves a moderate risk of morbidity and mortality.  The Differential Diagnoses include COVID, Flu, strep throat, PTA, retropharyngeal abscess.   Clinical Laboratory Tests Ordered, included COVID/Flu PCR and strep PCR.  COVID positive.  Flu negative.  Strep negative  Radiologic Tests Considered: Considered chest x-ray or imaging of the neck but patient has no stridor or hard signs of deeper space infection.  Lungs are clear with normal oxygen saturation.  No increased work of breathing. Defer imaging for now.    Reevaluation with update and discussion with patient.  His COVID test does come back  positive.  He is otherwise healthy and low risk.  Remote history of asthma but no active disease.  No wheezing or hypoxemia on exam.  Discussed antivirals versus supportive care and opting for supportive care at home with quarantine and PCP follow-up plan.  Discussed ED return precautions.  Disposition: Discharge  ____________________________________________  FINAL CLINICAL  IMPRESSION(S) / ED DIAGNOSES  Final diagnoses:  COVID-19    NEW OUTPATIENT MEDICATIONS STARTED DURING THIS VISIT:  Discharge Medication List as of 05/03/2021 12:24 PM     START taking these medications   Details  benzonatate (TESSALON) 100 MG capsule Take 1 capsule (100 mg total) by mouth 3 (three) times daily as needed for cough., Starting Mon 05/03/2021, Normal    fluticasone (FLONASE) 50 MCG/ACT nasal spray Place 2 sprays into both nostrils daily for 7 days., Starting Mon 05/03/2021, Until Mon 05/10/2021, Normal    ibuprofen (ADVIL) 800 MG tablet Take 1 tablet (800 mg total) by mouth every 8 (eight) hours as needed for headache or moderate pain., Starting Mon 05/03/2021, Normal        Note:  This document was prepared using Dragon voice recognition software and may include unintentional dictation errors.  Nanda Quinton, MD, [facility]    Bayli Quesinberry, Wonda Olds, MD 05/06/21 [phone]

## 2021-05-03 NOTE — ED Triage Notes (Signed)
Pt arrives to ED with c/o sore throat. This started yesterday. No chills, fever, dysphagia.

## 2021-05-24 NOTE — Patient Instructions (Addendum)
Blood work was ordered.     Medications changes include : none     A referral was ordered for a therapist.       Someone from their office will call you to schedule an appointment. Below are some other therapists you can call and see.     [facility] at [facility] [phone]  Dr Doran Heater - [phone] Dr Carlus Pavlov   [phone] Dr Evalee Jefferson   [phone]  Kerby Moors - clinical social worker/therapist -  [phone]  [facility] [address] [phone]  [facility]  [address] [phone]  [facility] [address] [phone] Office  [facility]            [address] [phone]   Please followup in  1 year    Health Maintenance, Male Adopting a healthy lifestyle and getting preventive care are important in promoting health and wellness. Ask your health care provider about: The right schedule for you to have regular tests and exams. Things you can do on your own to prevent diseases and keep yourself healthy. What should I know about diet, weight, and exercise? Eat a healthy diet  Eat a diet that includes plenty of vegetables, fruits, low-fat dairy products, and lean protein. Do not eat a lot of foods that are high in solid fats, added sugars, or sodium. Maintain a healthy weight Body mass index (BMI) is a measurement that can be used to identify possible weight problems. It estimates body fat based on height and weight. Your health care provider can help determine your BMI and help you achieve or maintain a healthy weight. Get regular exercise Get regular exercise. This is one of the most important things you can do for your health. Most adults should: Exercise for at least 150 minutes each week. The exercise should increase your heart rate and make  you sweat (moderate-intensity exercise). Do strengthening exercises at least twice a week. This is in addition to the moderate-intensity exercise. Spend less time sitting. Even light physical activity can be beneficial. Watch cholesterol and blood lipids Have your blood tested for lipids and cholesterol at 37 years of age, then have this test every 5 years. You may need to have your cholesterol levels checked more often if: Your lipid or cholesterol levels are high. You are older than 37 years of age. You are at high risk for heart disease. What should I know about cancer screening? Many types of cancers can be detected early and may often be prevented. Depending on your health history and family history, you may need to have cancer screening at various ages. This may include screening for: Colorectal cancer. Prostate cancer. Skin cancer. Lung cancer. What should I know about heart disease, diabetes, and high blood pressure? Blood pressure and heart disease High blood pressure causes heart disease and increases the risk of stroke. This is more likely to develop in people who have high blood pressure readings or are overweight. Talk with your health care provider about your target blood pressure readings. Have your blood pressure checked: Every 3-5 years if you are 63-65 years of age. Every year if you are 11 years old or older. If you are between the ages of 57 and 65 and are a current or former smoker, ask your health care provider if you should have a one-time screening for  abdominal aortic aneurysm (AAA). Diabetes Have regular diabetes screenings. This checks your fasting blood sugar level. Have the screening done: Once every three years after age 72 if you are at a normal weight and have a low risk for diabetes. More often and at a younger age if you are overweight or have a high risk for diabetes. What should I know about preventing infection? Hepatitis B If you have a higher risk  for hepatitis B, you should be screened for this virus. Talk with your health care provider to find out if you are at risk for hepatitis B infection. Hepatitis C Blood testing is recommended for: Everyone born from [birth year] through 1966. Anyone with known risk factors for hepatitis C. Sexually transmitted infections (STIs) You should be screened each year for STIs, including gonorrhea and chlamydia, if: You are sexually active and are younger than 37 years of age. You are older than 37 years of age and your health care provider tells you that you are at risk for this type of infection. Your sexual activity has changed since you were last screened, and you are at increased risk for chlamydia or gonorrhea. Ask your health care provider if you are at risk. Ask your health care provider about whether you are at high risk for HIV. Your health care provider may recommend a prescription medicine to help prevent HIV infection. If you choose to take medicine to prevent HIV, you should first get tested for HIV. You should then be tested every 3 months for as long as you are taking the medicine. Follow these instructions at home: Alcohol use Do not drink alcohol if your health care provider tells you not to drink. If you drink alcohol: Limit how much you have to 0-2 drinks a day. Know how much alcohol is in your drink. In the [place] one drink equals one 12 oz bottle of beer (355 mL), one 5 oz glass of wine (148 mL), or one 1 oz glass of hard liquor (44 mL). Lifestyle Do not use any products that contain nicotine or tobacco. These products include cigarettes, chewing tobacco, and vaping devices, such as e-cigarettes. If you need help quitting, ask your health care provider. Do not use street drugs. Do not share needles. Ask your health care provider for help if you need support or information about quitting drugs. General instructions Schedule regular health, dental, and eye exams. Stay current with your  vaccines. Tell your health care provider if: You often feel depressed. You have ever been abused or do not feel safe at home. Summary Adopting a healthy lifestyle and getting preventive care are important in promoting health and wellness. Follow your health care provider's instructions about healthy diet, exercising, and getting tested or screened for diseases. Follow your health care provider's instructions on monitoring your cholesterol and blood pressure. This information is not intended to replace advice given to you by your health care provider. Make sure you discuss any questions you have with your health care provider. Document Revised: 08/31/2020 Document Reviewed: 08/31/2020 Elsevier Patient Education  2022 [employer].
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
## 2021-05-25 ENCOUNTER — Encounter: Payer: Self-pay | Admitting: Internal Medicine

## 2021-05-25 ENCOUNTER — Ambulatory Visit (INDEPENDENT_AMBULATORY_CARE_PROVIDER_SITE_OTHER): Payer: BC Managed Care – PPO | Admitting: Internal Medicine

## 2021-05-25 VITALS — BP 116/78 | HR 86 | Temp 98.8°F | Ht 69.0 in | Wt 211.0 lb

## 2021-05-25 DIAGNOSIS — R7989 Other specified abnormal findings of blood chemistry: Secondary | ICD-10-CM

## 2021-05-25 DIAGNOSIS — R7303 Prediabetes: Secondary | ICD-10-CM | POA: Diagnosis not present

## 2021-05-25 DIAGNOSIS — Z0000 Encounter for general adult medical examination without abnormal findings: Secondary | ICD-10-CM | POA: Diagnosis not present

## 2021-05-25 DIAGNOSIS — Z1331 Encounter for screening for depression: Secondary | ICD-10-CM

## 2021-05-25 DIAGNOSIS — F3289 Other specified depressive episodes: Secondary | ICD-10-CM

## 2021-05-25 NOTE — Assessment & Plan Note (Signed)
Chronic Check a1c Low sugar / carb diet Stressed regular exercise  

## 2021-05-25 NOTE — Assessment & Plan Note (Signed)
Acute on chronic He has a history of depression and states current depression PHQ-9 score is elevated at 13 indicating moderate depression He is interested in seeing a therapist-referral ordered.  I also gave him names and numbers of other therapist that he can try calling Discussed starting a daily antidepressant and after reviewing possible side effects he deferred Discussed that if he changes his mind he should let me know so we can consider starting the medication He is concerned about returning to work at his part-time job-specifically working with certain people.  He was hoping that work will accommodate them.  I did write a note asking work to accommodate him if his requests are reasonable

## 2021-05-25 NOTE — Assessment & Plan Note (Signed)
Chronic Check testosterone

## 2021-05-25 NOTE — Assessment & Plan Note (Signed)
History of elevated TSH in the past Recheck TSH

## 2021-05-26 LAB — CBC WITH DIFFERENTIAL/PLATELET
Basophils Absolute: 0.1 10*3/uL (ref 0.0–0.1)
Basophils Relative: 1.3 % (ref 0.0–3.0)
Eosinophils Absolute: 0.1 10*3/uL (ref 0.0–0.7)
Eosinophils Relative: 2.6 % (ref 0.0–5.0)
HCT: 52.7 % — ABNORMAL HIGH (ref 39.0–52.0)
Hemoglobin: 17.2 g/dL — ABNORMAL HIGH (ref 13.0–17.0)
Lymphocytes Relative: 35.7 % (ref 12.0–46.0)
Lymphs Abs: 1.8 10*3/uL (ref 0.7–4.0)
MCHC: 32.6 g/dL (ref 30.0–36.0)
MCV: 91.7 fl (ref 78.0–100.0)
Monocytes Absolute: 0.3 10*3/uL (ref 0.1–1.0)
Monocytes Relative: 6.8 % (ref 3.0–12.0)
Neutro Abs: 2.7 10*3/uL (ref 1.4–7.7)
Neutrophils Relative %: 53.6 % (ref 43.0–77.0)
Platelets: 293 10*3/uL (ref 150.0–400.0)
RBC: 5.75 Mil/uL (ref 4.22–5.81)
RDW: 13.4 % (ref 11.5–15.5)
WBC: 5.1 10*3/uL (ref 4.0–10.5)

## 2021-05-26 LAB — COMPREHENSIVE METABOLIC PANEL
ALT: 26 U/L (ref 0–53)
AST: 19 U/L (ref 0–37)
Albumin: 4.1 g/dL (ref 3.5–5.2)
Alkaline Phosphatase: 53 U/L (ref 39–117)
BUN: 13 mg/dL (ref 6–23)
CO2: 29 mEq/L (ref 19–32)
Calcium: 9.1 mg/dL (ref 8.4–10.5)
Chloride: 103 mEq/L (ref 96–112)
Creatinine, Ser: 0.87 mg/dL (ref 0.40–1.50)
GFR: 110.7 mL/min (ref >60.00)
Glucose, Bld: 89 mg/dL (ref 70–99)
Potassium: 3.6 mEq/L (ref 3.5–5.1)
Sodium: 138 mEq/L (ref 135–145)
Total Bilirubin: 0.5 mg/dL (ref 0.2–1.2)
Total Protein: 6.9 g/dL (ref 6.0–8.3)

## 2021-05-26 LAB — LIPID PANEL
Cholesterol: 180 mg/dL (ref 0–200)
HDL: 23.9 mg/dL — ABNORMAL LOW (ref >39.00)
LDL Cholesterol: 124 mg/dL — ABNORMAL HIGH (ref 0–99)
NonHDL: 155.69
Total CHOL/HDL Ratio: 8
Triglycerides: 159 mg/dL — ABNORMAL HIGH (ref 0.0–149.0)
VLDL: 31.8 mg/dL (ref 0.0–40.0)

## 2021-05-26 LAB — HEMOGLOBIN A1C: Hgb A1c MFr Bld: 5.9 % (ref 4.6–6.5)

## 2021-05-26 LAB — TSH: TSH: 4.23 u[IU]/mL (ref 0.35–5.50)

## 2021-05-29 LAB — TESTOSTERONE, FREE & TOTAL
Free Testosterone: 40.8 pg/mL (ref 35.0–155.0)
Testosterone, Total, LC-MS-MS: 381 ng/dL (ref 250–1100)

## 2021-06-22 ENCOUNTER — Emergency Department (HOSPITAL_BASED_OUTPATIENT_CLINIC_OR_DEPARTMENT_OTHER)
Admission: EM | Admit: 2021-06-22 | Discharge: 2021-06-22 | Disposition: A | Discharge status: Home / Self Care | Payer: BC Managed Care – PPO | Attending: Emergency Medicine | Admitting: Emergency Medicine

## 2021-06-22 ENCOUNTER — Emergency Department (HOSPITAL_BASED_OUTPATIENT_CLINIC_OR_DEPARTMENT_OTHER): Payer: BC Managed Care – PPO | Admitting: Radiology

## 2021-06-22 ENCOUNTER — Encounter (HOSPITAL_BASED_OUTPATIENT_CLINIC_OR_DEPARTMENT_OTHER): Payer: Self-pay | Admitting: Urology

## 2021-06-22 ENCOUNTER — Other Ambulatory Visit: Payer: Self-pay

## 2021-06-22 DIAGNOSIS — R0602 Shortness of breath: Secondary | ICD-10-CM | POA: Diagnosis present

## 2021-06-22 DIAGNOSIS — J45909 Unspecified asthma, uncomplicated: Secondary | ICD-10-CM | POA: Diagnosis not present

## 2021-06-22 MED ORDER — AEROCHAMBER PLUS FLO-VU MEDIUM MISC
1.0000 | Freq: Once | Status: AC
  Administered 2021-06-22: 1
  Filled 2021-06-22: qty 1

## 2021-06-22 MED ORDER — ALBUTEROL SULFATE HFA 108 (90 BASE) MCG/ACT IN AERS: 2.0000 | INHALATION_SPRAY | RESPIRATORY_TRACT | Status: DC | PRN

## 2021-06-22 MED ORDER — ALBUTEROL SULFATE HFA 108 (90 BASE) MCG/ACT IN AERS
6.0000 | INHALATION_SPRAY | Freq: Once | RESPIRATORY_TRACT | Status: AC
Start: 2021-06-22 — End: 2021-06-22
  Administered 2021-06-22: 6 via RESPIRATORY_TRACT
  Filled 2021-06-22: qty 6.7

## 2021-06-22 NOTE — Discharge Instructions (Signed)
Use 2 puffs of the albuterol inhaler every 4-6 hours for the next 3 days.  After that you can use the inhaler every 4-6 hours as needed for shortness of breath or wheezing.  Please use the spacer when using the inhaler.  Please follow up with your primary care provider within 5-7 days for re-evaluation of your symptoms. Please return to the emergency department for any new or worsening symptoms.
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
## 2021-06-22 NOTE — ED Notes (Signed)
Patient transported to X-ray 
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
## 2021-06-28 ENCOUNTER — Ambulatory Visit: Payer: BC Managed Care – PPO | Admitting: Internal Medicine

## 2021-06-28 ENCOUNTER — Other Ambulatory Visit: Payer: Self-pay

## 2021-06-28 ENCOUNTER — Encounter: Payer: Self-pay | Admitting: Internal Medicine

## 2021-06-28 VITALS — BP 118/78 | HR 79 | Temp 98.2°F | Ht 69.0 in | Wt 214.8 lb

## 2021-06-28 DIAGNOSIS — J45901 Unspecified asthma with (acute) exacerbation: Secondary | ICD-10-CM

## 2021-06-28 MED ORDER — FLUTICASONE PROPIONATE HFA 110 MCG/ACT IN AERO: 2.0000 | INHALATION_SPRAY | Freq: Two times a day (BID) | RESPIRATORY_TRACT | 5 refills | Status: AC

## 2021-06-28 MED ORDER — ALBUTEROL SULFATE HFA 108 (90 BASE) MCG/ACT IN AERS: 2.0000 | INHALATION_SPRAY | Freq: Four times a day (QID) | RESPIRATORY_TRACT | 2 refills | Status: AC | PRN

## 2021-06-28 NOTE — Patient Instructions (Addendum)
? ? ? ? ? ?  Medications changes include :    ? ?start flovent inhaler twice a day.   ? ?Start an over the counter allergy medication daily - claritin daily  Continue the albuterol inhaler as needed ? ? ?Your prescription(s) have been sent to your pharmacy.  ? ? ? ? ?Return if symptoms worsen or fail to improve. ? ?
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
## 2021-08-11 ENCOUNTER — Other Ambulatory Visit: Payer: Self-pay

## 2021-08-11 ENCOUNTER — Emergency Department (HOSPITAL_BASED_OUTPATIENT_CLINIC_OR_DEPARTMENT_OTHER)
Admission: EM | Admit: 2021-08-11 | Discharge: 2021-08-11 | Disposition: A | Discharge status: Home / Self Care | Payer: BC Managed Care – PPO | Attending: Emergency Medicine | Admitting: Emergency Medicine

## 2021-08-11 ENCOUNTER — Encounter (HOSPITAL_BASED_OUTPATIENT_CLINIC_OR_DEPARTMENT_OTHER): Payer: Self-pay | Admitting: Emergency Medicine

## 2021-08-11 DIAGNOSIS — H6692 Otitis media, unspecified, left ear: Secondary | ICD-10-CM | POA: Diagnosis not present

## 2021-08-11 DIAGNOSIS — H6690 Otitis media, unspecified, unspecified ear: Secondary | ICD-10-CM

## 2021-08-11 DIAGNOSIS — H9202 Otalgia, left ear: Secondary | ICD-10-CM | POA: Diagnosis present

## 2021-08-11 MED ORDER — AMOXICILLIN 500 MG PO CAPS
500.0000 mg | ORAL_CAPSULE | Freq: Three times a day (TID) | ORAL | 0 refills | Status: DC
Start: 2021-08-11 — End: 2021-08-26

## 2021-08-11 NOTE — Discharge Instructions (Addendum)
Continue taking Tylenol or Motrin for your ear pain.  Take the antibiotics as written as well. ? ?Call your primary care doctor or specialist as discussed in the next 2-3 days.   ?Return immediately back to the ER if: ? ?Your symptoms worsen within the next 12-24 hours. ?You develop new symptoms such as new fevers, persistent vomiting, new pain, shortness of breath, or new weakness or numbness, or if you have any other concerns. ? ?

## 2021-08-11 NOTE — ED Triage Notes (Signed)
Left ear pain started 2 days ago,denies fever, congestion.  ?

## 2021-08-11 NOTE — ED Provider Notes (Signed)
?[facility] EMER[facility]ENCY DEPT ?Provider Note ? ? ?CSN: [id number] ?Arrival date & time: 08/11/21  2229 ? ?  ? ?History ? ?Chief Complaint  ?Patient presents with  ? Otalgia  ? ? ?Danny Rubio is a 36 y.o. male. ? ?Patient presents with left ear pain 2 days.  Describes as aching sharp nonradiating.  No fever no cough no vomiting or diarrhea. ? ? ?  ? ?Home Medications ?Prior to Admission medications   ?Medication Sig Start Date End Date Taking? Authorizing Provider  ?amoxicillin (AMOXIL) 500 MG capsule Take 1 capsule (500 mg total) by mouth 3 (three) times daily. 08/11/21  Yes Cheryll Cockayne, MD  ?albuterol (VENTOLIN HFA) 108 (90 Base) MCG/ACT inhaler Inhale 2 puffs into the lungs every 6 (six) hours as needed for wheezing or shortness of breath. 06/28/21   Pincus Sanes, MD  ?fluticasone (FLOVENT HFA) 110 MCG/ACT inhaler Inhale 2 puffs into the lungs 2 (two) times daily. 06/28/21   Pincus Sanes, MD  ?   ? ?Allergies    ?Patient has no known allergies.   ? ?Review of Systems   ?Review of Systems  ?Constitutional:  Negative for fever.  ?HENT:  Negative for ear pain and sore throat.   ?Eyes:  Negative for pain.  ?Respiratory:  Negative for cough.   ?Cardiovascular:  Negative for chest pain.  ?Gastrointestinal:  Negative for abdominal pain.  ?Genitourinary:  Negative for flank pain.  ?Musculoskeletal:  Negative for back pain.  ?Skin:  Negative for color change and rash.  ?Neurological:  Negative for syncope.  ?All other systems reviewed and are negative. ? ?Physical Exam ?Updated Vital Signs ?BP 126/88 (BP Location: Right Arm)   Pulse 82   Temp 98.3 ?F (36.8 ?C) (Oral)   Resp 20   SpO2 100%  ?Physical Exam ?Constitutional:   ?   Appearance: He is well-developed.  ?HENT:  ?   Head: Normocephalic.  ?   Ears:  ?   Comments: Left TM is erythematous.  No mastoid tenderness.  No pain with manipulation of the pinna of the ear.  Right TM is clear. ?   Nose: Nose normal.  ?Eyes:  ?   Extraocular Movements:  Extraocular movements intact.  ?Cardiovascular:  ?   Rate and Rhythm: Normal rate.  ?Pulmonary:  ?   Effort: Pulmonary effort is normal.  ?Skin: ?   Coloration: Skin is not jaundiced.  ?Neurological:  ?   Mental Status: He is alert. Mental status is at baseline.  ? ? ?ED Results / Procedures / Treatments   ?Labs ?(all labs ordered are listed, but only abnormal results are displayed) ?Labs Reviewed - No data to display ? ?EKG ?None ? ?Radiology ?No results found. ? ?Procedures ?Procedures  ? ? ?Medications Ordered in ED ?Medications - No data to display ? ?ED Course/ Medical Decision Making/ A&P ?  ?                        ?Medical Decision Making ? ?Chart review shows office visit for asthma June 28, 2021. ? ?No additional diagnostic test performed. ? ?Patient given prescription amoxicillin, advised outpatient follow-up with his doctor this week.  Advised return for worsening symptoms or any additional concerns. ? ? ? ? ? ? ? ?Final Clinical Impression(s) / ED Diagnoses ?Final diagnoses:  ?Acute otitis media, unspecified otitis media type  ? ? ?Rx / DC Orders ?ED Discharge Orders   ? ?  Ordered  ?  amoxicillin (AMOXIL) 500 MG capsule  3 times daily       ? 08/11/21 1102  ? ?  ?  ? ?  ? ? ?  ?Cheryll Cockayne, MD ?08/11/21 1102 ? ?

## 2021-08-11 NOTE — ED Notes (Signed)
Dc instructions reviewed with patient. Patient voiced understanding. Dc with belongings.  °

## 2021-08-26 ENCOUNTER — Other Ambulatory Visit (HOSPITAL_BASED_OUTPATIENT_CLINIC_OR_DEPARTMENT_OTHER): Payer: Self-pay

## 2021-08-26 ENCOUNTER — Other Ambulatory Visit: Payer: Self-pay

## 2021-08-26 ENCOUNTER — Emergency Department (HOSPITAL_BASED_OUTPATIENT_CLINIC_OR_DEPARTMENT_OTHER)
Admission: EM | Admit: 2021-08-26 | Discharge: 2021-08-26 | Disposition: A | Discharge status: Home / Self Care | Payer: BC Managed Care – PPO | Attending: Emergency Medicine | Admitting: Emergency Medicine

## 2021-08-26 ENCOUNTER — Encounter (HOSPITAL_BASED_OUTPATIENT_CLINIC_OR_DEPARTMENT_OTHER): Payer: Self-pay | Admitting: Emergency Medicine

## 2021-08-26 DIAGNOSIS — J45909 Unspecified asthma, uncomplicated: Secondary | ICD-10-CM | POA: Diagnosis not present

## 2021-08-26 DIAGNOSIS — K047 Periapical abscess without sinus: Secondary | ICD-10-CM | POA: Diagnosis not present

## 2021-08-26 DIAGNOSIS — K0889 Other specified disorders of teeth and supporting structures: Secondary | ICD-10-CM | POA: Diagnosis present

## 2021-08-26 MED ORDER — LIDOCAINE-EPINEPHRINE (PF) 2 %-1:200000 IJ SOLN
10.0000 mL | Freq: Once | INTRAMUSCULAR | Status: DC
Start: 2021-08-26 — End: 2021-08-26
  Filled 2021-08-26: qty 20

## 2021-08-26 MED ORDER — HYDROCODONE-ACETAMINOPHEN 5-325 MG PO TABS
1.0000 | ORAL_TABLET | ORAL | 0 refills | Status: DC | PRN
  Filled 2021-08-26: qty 10, 2d supply, fill #0

## 2021-08-26 MED ORDER — IBUPROFEN 600 MG PO TABS
600.0000 mg | ORAL_TABLET | Freq: Four times a day (QID) | ORAL | 0 refills | Status: DC | PRN
Start: 2021-08-26 — End: 2024-01-26
  Filled 2021-08-26: qty 30, 8d supply, fill #0

## 2021-08-26 MED ORDER — AMOXICILLIN 500 MG PO CAPS
500.0000 mg | ORAL_CAPSULE | Freq: Three times a day (TID) | ORAL | 0 refills | Status: DC
  Filled 2021-08-26: qty 21, 7d supply, fill #0

## 2021-08-26 MED ORDER — IBUPROFEN 800 MG PO TABS
800.0000 mg | ORAL_TABLET | Freq: Once | ORAL | Status: AC
Start: 2021-08-26 — End: 2021-08-26
  Administered 2021-08-26: 800 mg via ORAL
  Filled 2021-08-26: qty 1

## 2021-08-26 MED ORDER — AMOXICILLIN 500 MG PO CAPS
500.0000 mg | ORAL_CAPSULE | Freq: Once | ORAL | Status: AC
  Administered 2021-08-26: 500 mg via ORAL
  Filled 2021-08-26: qty 1

## 2021-08-26 NOTE — ED Provider Notes (Signed)
?[facility] EMERGENCY DEPT ?Provider Note ? ? ?CSN: [id number] ?Arrival date & time: 08/26/21  [id number] ? ?  ? ?History ? ?Chief Complaint  ?Patient presents with  ? Dental Pain  ? ? ?Audrey Sudbury is a 36 y.o. male. ? ?Pt is a 36 yo male with a pmhx significant for asthma as a child.  He said he has had dental pain for the past 3 days.  He feels a bump behind his tooth.  No other sx. ? ? ?  ? ?Home Medications ?Prior to Admission medications   ?Medication Sig Start Date End Date Taking? Authorizing Provider  ?HYDROcodone-acetaminophen (NORCO/VICODIN) 5-325 MG tablet Take 1 tablet by mouth every 4 (four) hours as needed. 08/26/21  Yes Isla Pence, MD  ?ibuprofen (ADVIL) 600 MG tablet Take 1 tablet (600 mg total) by mouth every 6 (six) hours as needed. 08/26/21  Yes Isla Pence, MD  ?albuterol (VENTOLIN HFA) 108 (90 Base) MCG/ACT inhaler Inhale 2 puffs into the lungs every 6 (six) hours as needed for wheezing or shortness of breath. 06/28/21   Binnie Rail, MD  ?amoxicillin (AMOXIL) 500 MG capsule Take 1 capsule (500 mg total) by mouth 3 (three) times daily. 08/26/21   Isla Pence, MD  ?fluticasone (FLOVENT HFA) 110 MCG/ACT inhaler Inhale 2 puffs into the lungs 2 (two) times daily. 06/28/21   Binnie Rail, MD  ?   ? ?Allergies    ?Patient has no known allergies.   ? ?Review of Systems   ?Review of Systems  ?HENT:  Positive for dental problem.   ?All other systems reviewed and are negative. ? ?Physical Exam ?Updated Vital Signs ?BP 118/85 (BP Location: Left Arm)   Pulse 73   Temp 97.6 ?F (36.4 ?C) (Temporal)   Resp 16   Ht 5\' 8"  (1.727 m)   Wt 94.8 kg   SpO2 98%   BMI 31.78 kg/m?  ?Physical Exam ?Vitals and nursing note reviewed.  ?Constitutional:   ?   Appearance: Normal appearance.  ?HENT:  ?   Head: Normocephalic and atraumatic.  ?   Comments: Dental abscess behind #10.  Pt has braces which he's had since 2019. ?   Right Ear: External ear normal.  ?   Left Ear: External ear normal.  ?    Nose: Nose normal.  ?   Mouth/Throat:  ?   Mouth: Mucous membranes are moist.  ?   Pharynx: Oropharynx is clear.  ?Eyes:  ?   Extraocular Movements: Extraocular movements intact.  ?   Conjunctiva/sclera: Conjunctivae normal.  ?   Pupils: Pupils are equal, round, and reactive to light.  ?Cardiovascular:  ?   Rate and Rhythm: Normal rate and regular rhythm.  ?   Pulses: Normal pulses.  ?   Heart sounds: Normal heart sounds.  ?Pulmonary:  ?   Effort: Pulmonary effort is normal.  ?   Breath sounds: Normal breath sounds.  ?Abdominal:  ?   General: Abdomen is flat. Bowel sounds are normal.  ?   Palpations: Abdomen is soft.  ?Musculoskeletal:     ?   General: Normal range of motion.  ?   Cervical back: Normal range of motion and neck supple.  ?Skin: ?   General: Skin is warm.  ?   Capillary Refill: Capillary refill takes less than 2 seconds.  ?Neurological:  ?   General: No focal deficit present.  ?   Mental Status: He is alert and oriented to person, place, and time.  ?  Psychiatric:     ?   Mood and Affect: Mood normal.     ?   Behavior: Behavior normal.  ? ? ?ED Results / Procedures / Treatments   ?Labs ?(all labs ordered are listed, but only abnormal results are displayed) ?Labs Reviewed - No data to display ? ?EKG ?None ? ?Radiology ?No results found. ? ?Procedures ?Marland Kitchen.Incision and Drainage ? ?Date/Time: 08/26/2021 11:49 AM ?Performed by: Isla Pence, MD ?Authorized by: Isla Pence, MD  ? ?Consent:  ?  Consent obtained:  Verbal ?  Consent given by:  Patient ?Location:  ?  Type:  Abscess ?  Location:  Mouth ?Anesthesia:  ?  Anesthesia method:  Local infiltration ?  Local anesthetic:  Lidocaine 1% WITH epi ?Procedure type:  ?  Complexity:  Simple ?Procedure details:  ?  Incision types:  Single straight ?  Drainage amount:  Scant ?  Wound treatment:  Wound left open ?Post-procedure details:  ?  Procedure completion:  Tolerated well, no immediate complications  ? ? ?Medications Ordered in ED ?Medications   ?lidocaine-EPINEPHrine (XYLOCAINE W/EPI) 2 %-1:200000 (PF) injection 10 mL (has no administration in time range)  ?amoxicillin (AMOXIL) capsule 500 mg (has no administration in time range)  ?ibuprofen (ADVIL) tablet 800 mg (has no administration in time range)  ? ? ?ED Course/ Medical Decision Making/ A&P ?  ?                        ?Medical Decision Making ?Risk ?Prescription drug management. ? ? ?Pt's abscess drained.  Pt started on Amox/lortab/ibuprofen.  He is to return if worse.  He needs to f/u with his dentist. ? ? ? ? ? ? ? ?Final Clinical Impression(s) / ED Diagnoses ?Final diagnoses:  ?Dental abscess  ? ? ?Rx / DC Orders ?ED Discharge Orders   ? ?      Ordered  ?  amoxicillin (AMOXIL) 500 MG capsule  3 times daily       ? 08/26/21 1148  ?  ibuprofen (ADVIL) 600 MG tablet  Every 6 hours PRN       ? 08/26/21 1148  ?  HYDROcodone-acetaminophen (NORCO/VICODIN) 5-325 MG tablet  Every 4 hours PRN       ? 08/26/21 1148  ? ?  ?  ? ?  ? ? ?  ?Isla Pence, MD ?08/26/21 1151 ? ?

## 2021-08-26 NOTE — ED Notes (Signed)
Dc instructions reviewed with patient. Patient voiced understanding. Dc with belongings.  °

## 2021-08-26 NOTE — ED Triage Notes (Signed)
Pt arrives to ED with c/o dental pain that started x3 days ago.  ?

## 2021-12-01 ENCOUNTER — Emergency Department (HOSPITAL_BASED_OUTPATIENT_CLINIC_OR_DEPARTMENT_OTHER)
Admission: EM | Admit: 2021-12-01 | Discharge: 2021-12-01 | Disposition: A | Discharge status: Home / Self Care | Payer: BC Managed Care – PPO | Attending: Emergency Medicine | Admitting: Emergency Medicine

## 2021-12-01 ENCOUNTER — Emergency Department (HOSPITAL_BASED_OUTPATIENT_CLINIC_OR_DEPARTMENT_OTHER): Payer: BC Managed Care – PPO | Admitting: Radiology

## 2021-12-01 ENCOUNTER — Other Ambulatory Visit: Payer: Self-pay

## 2021-12-01 ENCOUNTER — Emergency Department (HOSPITAL_BASED_OUTPATIENT_CLINIC_OR_DEPARTMENT_OTHER): Payer: BC Managed Care – PPO

## 2021-12-01 ENCOUNTER — Encounter (HOSPITAL_BASED_OUTPATIENT_CLINIC_OR_DEPARTMENT_OTHER): Payer: Self-pay

## 2021-12-01 DIAGNOSIS — M79672 Pain in left foot: Secondary | ICD-10-CM | POA: Diagnosis present

## 2021-12-01 DIAGNOSIS — M7989 Other specified soft tissue disorders: Secondary | ICD-10-CM | POA: Diagnosis not present

## 2021-12-01 NOTE — Discharge Instructions (Signed)
Use Tylenol every 4 hours and ibuprofen every 6 hours as needed for pain or fever.  Use ice as needed and elevate as discussed.  Follow-up with local doctor if no improvement in 1 week.

## 2021-12-01 NOTE — ED Notes (Signed)
Discharge paperwork given and verbally understood. 

## 2021-12-01 NOTE — ED Provider Notes (Signed)
[facility] EM[facility]GENC[facility] DEPT Provider Note   CSN: [id number] Arrival date & time: 12/01/21  0941     History  Chief Complaint  Patient presents with   Foot Pain    Danny Rubio is a 36 y.o. male.  Patient presents with pain to the mid aspect top of left foot for 2 days.  No injuries recalled.  Patient does wear flip-flops.  Patient has no history of significant swelling, no blood clot history, no recent surgery.  Patient's sister died from blood clots 2 weeks after delivering her baby.  Patient has no shortness of breath or chest pain.       Home Medications Prior to Admission medications   Medication Sig Start Date End Date Taking? Authorizing Provider  albuterol (VENTOLIN HFA) 108 (90 Base) MCG/ACT inhaler Inhale 2 puffs into the lungs every 6 (six) hours as needed for wheezing or shortness of breath. 06/28/21   Pincus Sanes, MD  amoxicillin (AMOXIL) 500 MG capsule Take 1 capsule (500 mg total) by mouth 3 (three) times daily. 08/26/21   Jacalyn Lefevre, MD  fluticasone (FLOVENT HFA) 110 MCG/ACT inhaler Inhale 2 puffs into the lungs 2 (two) times daily. 06/28/21   Pincus Sanes, MD  HYDROcodone-acetaminophen (NORCO/VICODIN) 5-325 MG tablet Take 1 tablet by mouth every 4 (four) hours as needed. 08/26/21   Jacalyn Lefevre, MD  ibuprofen (ADVIL) 600 MG tablet Take 1 tablet (600 mg total) by mouth every 6 (six) hours as needed. 08/26/21   Jacalyn Lefevre, MD      Allergies    Patient has no known allergies.    Review of Systems   Review of Systems  Physical Exam Updated Vital Signs BP 125/75 (BP Location: Right Arm)   Pulse 60   Temp 98.4 F (36.9 C)   Resp 16   SpO2 98%  Physical Exam Vitals and nursing note reviewed.  Constitutional:      General: He is not in acute distress.    Appearance: He is well-developed.  HENT:     Head: Normocephalic and atraumatic.     Mouth/Throat:     Mouth: Mucous membranes are moist.  Eyes:     General:        Right eye:  No discharge.        Left eye: No discharge.     Conjunctiva/sclera: Conjunctivae normal.  Neck:     Trachea: No tracheal deviation.  Cardiovascular:     Rate and Rhythm: Normal rate.  Pulmonary:     Effort: Pulmonary effort is normal.  Abdominal:     General: There is no distension.  Musculoskeletal:        General: Tenderness present.     Cervical back: Normal range of motion and neck supple. No rigidity.     Comments: Patient has mild tenderness left dorsal midfoot without external signs of infection.  2+ pulses DP and PT in the left leg.  No significant edema in left lower extremity.  Mild swelling dorsal midfoot.  Skin:    General: Skin is warm.     Capillary Refill: Capillary refill takes less than 2 seconds.     Findings: No erythema or rash.  Neurological:     General: No focal deficit present.     Mental Status: He is alert.     Cranial Nerves: No cranial nerve deficit.  Psychiatric:        Mood and Affect: Mood normal.     ED Results / Procedures /  Treatments   Labs (all labs ordered are listed, but only abnormal results are displayed) Labs Reviewed - No data to display  EKG None  Radiology DG Foot Complete Left  Result Date: 12/01/2021 CLINICAL DATA:  Left foot pain for 2 days.  No known injury EXAM: LEFT FOOT - COMPLETE 3+ VIEW COMPARISON:  None Available. FINDINGS: There is no evidence of fracture or dislocation. Multipartite tibial hallux sesamoid. Tiny plantar calcaneal spur. There is no evidence of arthropathy or other focal bone abnormality. Soft tissues are unremarkable. IMPRESSION: 1. No acute osseous abnormality, left foot. 2. Tiny plantar calcaneal spur. Electronically Signed   By: Duanne Guess D.O.   On: 12/01/2021 10:35    Procedures Procedures    Medications Ordered in ED Medications - No data to display  ED Course/ Medical Decision Making/ A&P                           Medical Decision Making Amount and/or Complexity of Data  Reviewed Radiology: ordered.   Patient presents with mild left foot swelling and tenderness without evidence of infection and no traumatic injury.  Discussed likely MSK from walking/related issues.  Patient low risk blood clot, sister unfortunately had blood clot and died however she was pregnant as her risk factor.  Discussed ultrasound to reassure patient no blood clot related to this.  Discussed supportive care and reasons to return. X-ray reviewed no acute subluxation or fracture.  Ultrasound reviewed no acute blood clot.  Patient stable for discharge.        Final Clinical Impression(s) / ED Diagnoses Final diagnoses:  Acute foot pain, left    Rx / DC Orders ED Discharge Orders     None         Blane Ohara, MD 12/01/21 1416

## 2021-12-01 NOTE — ED Triage Notes (Signed)
Pt presents POV with pain to the top of his Left foot x2 days. Pt denies any injury. Pt feels his vein looks different on his left foot. He is also concerned for blood clots, sister had 2 blood clots and passed away Jun 07, 2021 d/t blood clot, 2 weeks after after delivering her baby and receiving covid vaccine.

## 2021-12-18 NOTE — Progress Notes (Deleted)
Subjective:    Patient ID: Danny Rubio, male    DOB: 05/16/1985, 36 y.o.   MRN: [id number]      HPI Lewi is here for No chief complaint on file.    Left foot pain - went to ED 8/9 for same. No injury.  Pain on mid aspect of top of left foot x 2 days.  No other symptoms.  Xray neg.  [place] for dvt neg.     Medications and allergies reviewed with patient and updated if appropriate.  Current Outpatient Medications on File Prior to Visit  Medication Sig Dispense Refill   albuterol (VENTOLIN HFA) 108 (90 Base) MCG/ACT inhaler Inhale 2 puffs into the lungs every 6 (six) hours as needed for wheezing or shortness of breath. 8 g 2   amoxicillin (AMOXIL) 500 MG capsule Take 1 capsule (500 mg total) by mouth 3 (three) times daily. 21 capsule 0   fluticasone (FLOVENT HFA) 110 MCG/ACT inhaler Inhale 2 puffs into the lungs 2 (two) times daily. 1 each 5   HYDROcodone-acetaminophen (NORCO/VICODIN) 5-325 MG tablet Take 1 tablet by mouth every 4 (four) hours as needed. 10 tablet 0   ibuprofen (ADVIL) 600 MG tablet Take 1 tablet (600 mg total) by mouth every 6 (six) hours as needed. 30 tablet 0   No current facility-administered medications on file prior to visit.    Review of Systems     Objective:  There were no vitals filed for this visit. BP Readings from Last 3 Encounters:  12/01/21 121/82  08/26/21 (!) 130/93  08/11/21 (!) 121/95   Wt Readings from Last 3 Encounters:  08/26/21 209 lb (94.8 kg)  06/28/21 214 lb 12.8 oz (97.4 kg)  06/22/21 210 lb 15.7 oz (95.7 kg)   There is no height or weight on file to calculate BMI.    Physical Exam       US Venous Img Lower  Left (DVT Study) CLINICAL DATA:  Pain and swelling  EXAM: Left LOWER EXTREMITY VENOUS DOPPLER ULTRASOUND  TECHNIQUE: Gray-scale sonography with compression, as well as color and duplex ultrasound, were performed to evaluate the deep venous system(s) from the level of the common femoral vein through the  popliteal and proximal calf veins.  COMPARISON:  None Available.  FINDINGS: VENOUS  Normal compressibility of the common femoral, superficial femoral, and popliteal veins, as well as the visualized calf veins. Visualized portions of profunda femoral vein and great saphenous vein unremarkable. No filling defects to suggest DVT on grayscale or color Doppler imaging. Doppler waveforms show normal direction of venous flow, normal respiratory plasticity and response to augmentation.  Limited views of the contralateral common femoral vein are unremarkable.  OTHER  None.  Limitations: none  IMPRESSION: There is no evidence of deep venous thrombosis in left lower extremity.  Electronically Signed   By: Ernie Avena M.D.   On: 12/01/2021 13:54 DG Foot Complete Left CLINICAL DATA:  Left foot pain for 2 days.  No known injury  EXAM: LEFT FOOT - COMPLETE 3+ VIEW  COMPARISON:  None Available.  FINDINGS: There is no evidence of fracture or dislocation. Multipartite tibial hallux sesamoid. Tiny plantar calcaneal spur. There is no evidence of arthropathy or other focal bone abnormality. Soft tissues are unremarkable.  IMPRESSION: 1. No acute osseous abnormality, left foot. 2. Tiny plantar calcaneal spur.  Electronically Signed   By: Duanne Guess D.O.   On: 12/01/2021 10:35    Assessment & Plan:    See  Problem List for Assessment and Plan of chronic medical problems.

## 2021-12-20 ENCOUNTER — Ambulatory Visit: Payer: BC Managed Care – PPO | Admitting: Internal Medicine

## 2022-02-07 ENCOUNTER — Other Ambulatory Visit: Payer: Self-pay

## 2022-02-07 ENCOUNTER — Emergency Department (HOSPITAL_BASED_OUTPATIENT_CLINIC_OR_DEPARTMENT_OTHER)
Admission: EM | Admit: 2022-02-07 | Discharge: 2022-02-07 | Disposition: A | Discharge status: Home / Self Care | Payer: BC Managed Care – PPO | Attending: Emergency Medicine | Admitting: Emergency Medicine

## 2022-02-07 ENCOUNTER — Encounter (HOSPITAL_BASED_OUTPATIENT_CLINIC_OR_DEPARTMENT_OTHER): Payer: Self-pay

## 2022-02-07 ENCOUNTER — Emergency Department (HOSPITAL_BASED_OUTPATIENT_CLINIC_OR_DEPARTMENT_OTHER): Payer: BC Managed Care – PPO | Admitting: Radiology

## 2022-02-07 DIAGNOSIS — J45909 Unspecified asthma, uncomplicated: Secondary | ICD-10-CM | POA: Diagnosis not present

## 2022-02-07 DIAGNOSIS — Z7951 Long term (current) use of inhaled steroids: Secondary | ICD-10-CM | POA: Insufficient documentation

## 2022-02-07 DIAGNOSIS — J069 Acute upper respiratory infection, unspecified: Secondary | ICD-10-CM | POA: Diagnosis not present

## 2022-02-07 DIAGNOSIS — Z1152 Encounter for screening for COVID-19: Secondary | ICD-10-CM | POA: Insufficient documentation

## 2022-02-07 DIAGNOSIS — R059 Cough, unspecified: Secondary | ICD-10-CM | POA: Diagnosis present

## 2022-02-07 DIAGNOSIS — R0602 Shortness of breath: Secondary | ICD-10-CM

## 2022-02-07 DIAGNOSIS — R051 Acute cough: Secondary | ICD-10-CM

## 2022-02-07 DIAGNOSIS — R062 Wheezing: Secondary | ICD-10-CM

## 2022-02-07 LAB — SARS CORONAVIRUS 2 BY RT PCR: SARS Coronavirus 2 by RT PCR: NEGATIVE

## 2022-02-07 MED ORDER — ALBUTEROL SULFATE HFA 108 (90 BASE) MCG/ACT IN AERS
2.0000 | INHALATION_SPRAY | Freq: Once | RESPIRATORY_TRACT | Status: AC
  Administered 2022-02-07: 2 via RESPIRATORY_TRACT
  Filled 2022-02-07: qty 6.7

## 2022-02-07 MED ORDER — PREDNISONE 50 MG PO TABS: 50.0000 mg | ORAL_TABLET | Freq: Every day | ORAL | 0 refills | Status: AC

## 2022-02-07 NOTE — ED Triage Notes (Signed)
Pt presents POV from home with cough, congestion, nasal drainage x1 week. Pt reports sometimes chokes when he coughs. Took OTC w/some relief

## 2022-02-07 NOTE — ED Provider Notes (Signed)
[place] EMERGENCY DEPT Provider Note   CSN: [id number] Arrival date & time: 02/07/22  0700     History  Chief Complaint  Patient presents with   Cough   Nasal Congestion    Danny Rubio is a 36 y.o. male.  The history is provided by the patient and medical records. No language interpreter was used.  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 Authorizing Provider  albuterol (VENTOLIN HFA) 108 (90 Base) MCG/ACT inhaler Inhale 2 puffs into the lungs every 6 (six) hours as needed for wheezing or shortness of breath. 06/28/21   Binnie Rail, MD  amoxicillin (AMOXIL) 500 MG capsule Take 1 capsule (500 mg total) by mouth 3 (three) times daily. 08/26/21   Isla Pence, MD  fluticasone (FLOVENT HFA) 110 MCG/ACT inhaler Inhale 2 puffs into the lungs 2 (two) times daily. 06/28/21   Binnie Rail, MD  HYDROcodone-acetaminophen (NORCO/VICODIN) 5-325 MG tablet Take 1 tablet by mouth every 4 (four) hours as needed. 08/26/21   Isla Pence, MD  ibuprofen (ADVIL) 600 MG tablet Take 1 tablet (600 mg total) by mouth every 6 (six) hours as needed. 08/26/21   Isla Pence, MD      Allergies    Patient has no known allergies.    Review of Systems   Review of Systems  Constitutional:  Negative for chills, fatigue and fever.  HENT:  Positive for congestion and  rhinorrhea. Negative for ear pain.   Eyes:  Negative for visual disturbance.  Respiratory:  Positive for cough, chest tightness, shortness of breath and wheezing. Negative for stridor.   Cardiovascular:  Negative for chest pain.  Gastrointestinal:  Negative for abdominal distention.  Genitourinary:  Negative for flank pain.  Musculoskeletal:  Negative for back pain and myalgias.  Skin:  Negative for rash.  Neurological:  Negative for light-headedness and headaches.  Psychiatric/Behavioral:  Negative for agitation.   All other systems reviewed and are negative.   Physical Exam Updated Vital Signs BP (!) 132/91 (BP Location: Right Arm)   Pulse 83   Temp 98.3 F (36.8 C)   Resp 16   SpO2 98%  Physical Exam Vitals and nursing note reviewed.  Constitutional:      General: He is not in acute distress.    Appearance: He is well-developed. He is not ill-appearing, toxic-appearing or diaphoretic.  HENT:     Head: Normocephalic and atraumatic.     Nose: Congestion present.     Mouth/Throat:     Mouth: Mucous membranes are moist.  Eyes:     Extraocular Movements: Extraocular movements intact.     Conjunctiva/sclera: Conjunctivae normal.     Pupils: Pupils are equal, round, and reactive to light.  Cardiovascular:     Rate and Rhythm: Normal rate and regular rhythm.     Heart sounds: No murmur heard. Pulmonary:     Effort: Pulmonary  effort is normal. No respiratory distress.     Breath sounds: Wheezing and rhonchi present. No rales.  Chest:     Chest wall: No tenderness.  Abdominal:     Palpations: Abdomen is soft.     Tenderness: There is no abdominal tenderness. There is no guarding or rebound.  Musculoskeletal:        General: No swelling or tenderness.     Cervical back: Neck supple. No tenderness.     Right lower leg: No edema.     Left lower leg: No edema.  Skin:    General: Skin is warm and dry.     Capillary Refill: Capillary refill takes less than 2 seconds.      Findings: No erythema.  Neurological:     General: No focal deficit present.     Mental Status: He is alert.  Psychiatric:        Mood and Affect: Mood normal.     ED Results / Procedures / Treatments   Labs (all labs ordered are listed, but only abnormal results are displayed) Labs Reviewed  SARS CORONAVIRUS 2 BY RT PCR    EKG None  Radiology DG Chest 2 View  Result Date: 02/07/2022 CLINICAL DATA:  Cough. Shortness of breath. Occasional smoker. History of asthma. EXAM: CHEST - 2 VIEW COMPARISON:  06/22/21 FINDINGS: No pleural effusion. No pneumothorax. No focal airspace opacity. Normal cardiac and mediastinal contours. No displaced rib fractures. Vertebral body heights are maintained. Visualized upper abdomen is unremarkable. IMPRESSION: No focal airspace opacity. Electronically Signed   By: Lorenza Cambridge M.D.   On: 02/07/2022 10:29    Procedures Procedures    Medications Ordered in ED Medications  albuterol (VENTOLIN HFA) 108 (90 Base) MCG/ACT inhaler 2 puff (2 puffs Inhalation Given 02/07/22 0946)    ED Course/ Medical Decision Making/ A&P                           Medical Decision Making Amount and/or Complexity of Data Reviewed Radiology: ordered.  Risk Prescription drug management.    Danny Rubio is a 36 y.o. male with a past medical history sniffing and for asthma who presents with 1 week of cough, short of breath, and wheezing.  According to patient, he works at a school and is unsure if there are been sick contacts but he has no known COVID exposures.  He says that he has had a cough for the last week that has been gradually worsening and has had intermittent production.  He denies any chest pain but does report some tightness and shortness of breath.  He reports wheezing and he does not have any more of his inhalers used in the past.  He denies any nausea, vomiting, constipation, diarrhea, or urinary changes.  Denies any trauma.  Denies any leg pain, leg  swelling, or other complaints.  On exam, lungs did have some rhonchi and wheezing.  Chest and abdomen were nontender.  Good pulses in extremities.  Legs did not appear edematous.  Patient resting and maintaining oxygen saturations on room air.  Given his productive cough and the rhonchi with his shortness of breath and wheezing symptoms, we did agree to get chest x-ray to rule out pneumonia.  COVID test was obtained in triage and was negative.  Suspected combination of weather changes and viral URI causing exacerbation of his wheezing and shortness of breath and cough.  If X-ray is reassuring, anticipate discharge with  burst of steroids and he will take inhaler at home.  Anticipate reassessment after x-ray.     11:20 AM X-ray did not show pneumonia.  Patient is feeling better after the albuterol.  We will give him burst of steroids in the inhaler to go home with.  He will follow-up with his PCP and understood return precautions.  Patient discharged in good condition.          Final Clinical Impression(s) / ED Diagnoses Final diagnoses:  Wheezing  Upper respiratory tract infection, unspecified type  Shortness of breath  Acute cough    Rx / DC Orders ED Discharge Orders          Ordered    predniSONE (DELTASONE) 50 MG tablet  Daily with breakfast        02/07/22 1119            Clinical Impression: 1. Wheezing   2. Upper respiratory tract infection, unspecified type   3. Shortness of breath   4. Acute cough     Disposition: Discharge  Condition: Good  I have discussed the results, Dx and Tx plan with the pt(& family if present). He/she/they expressed understanding and agree(s) with the plan. Discharge instructions discussed at great length. Strict return precautions discussed and pt &/or family have verbalized understanding of the instructions. No further questions at time of discharge.    New Prescriptions   PREDNISONE (DELTASONE) 50 MG TABLET    Take 1 tablet  (50 mg total) by mouth daily with breakfast for 5 days.    Follow Up: Pincus Sanes, MD [address] [place] [address] [phone]        Valentina Alcoser, Canary Brim, MD 02/07/22 1121

## 2022-02-07 NOTE — Discharge Instructions (Signed)
Your history, exam, and evaluation today are consistent with an asthma exacerbation in the setting of likely viral upper respiratory infection and you tested negative for COVID.  The x-ray did not show pneumonia that would require antibiotics.  I suspect the season changes are also contributing to your wheezing.  Please take the burst of steroids for the next several days and use the inhaler at home.  Please rest and stay hydrated.  If any symptoms change or worsen acutely, please return to the nearest emergency department.

## 2022-05-01 ENCOUNTER — Encounter: Payer: Self-pay | Admitting: Internal Medicine

## 2022-05-01 NOTE — Progress Notes (Signed)
    Subjective:    Patient ID: Danny Rubio, male    DOB: 1985/10/12, 37 y.o.   MRN: [id number]      HPI Matilde is here for No chief complaint on file.        Medications and allergies reviewed with patient and updated if appropriate.  Current Outpatient Medications on File Prior to Visit  Medication Sig Dispense Refill   albuterol (VENTOLIN HFA) 108 (90 Base) MCG/ACT inhaler Inhale 2 puffs into the lungs every 6 (six) hours as needed for wheezing or shortness of breath. 8 g 2   fluticasone (FLOVENT HFA) 110 MCG/ACT inhaler Inhale 2 puffs into the lungs 2 (two) times daily. 1 each 5   ibuprofen (ADVIL) 600 MG tablet Take 1 tablet (600 mg total) by mouth every 6 (six) hours as needed. 30 tablet 0   No current facility-administered medications on file prior to visit.    Review of Systems     Objective:  There were no vitals filed for this visit. BP Readings from Last 3 Encounters:  02/07/22 (!) 132/91  12/01/21 121/82  08/26/21 (!) 130/93   Wt Readings from Last 3 Encounters:  08/26/21 209 lb (94.8 kg)  06/28/21 214 lb 12.8 oz (97.4 kg)  06/22/21 210 lb 15.7 oz (95.7 kg)   There is no height or weight on file to calculate BMI.    Physical Exam         Assessment & Plan:    See Problem List for Assessment and Plan of chronic medical problems.     This encounter was created in error - please disregard.

## 2022-05-01 NOTE — Patient Instructions (Signed)
      Medications changes include :       A referral was ordered for XXX.     Someone will call you to schedule an appointment.    No follow-ups on file.  

## 2022-05-02 ENCOUNTER — Encounter: Payer: BC Managed Care – PPO | Admitting: Internal Medicine

## 2022-05-02 DIAGNOSIS — F3289 Other specified depressive episodes: Secondary | ICD-10-CM

## 2022-10-25 ENCOUNTER — Encounter: Payer: Self-pay | Admitting: Internal Medicine

## 2022-10-25 NOTE — Progress Notes (Deleted)
      Subjective:    Patient ID: Danny Rubio, male    DOB: [birth year], 37 y.o.   MRN: [id number]     HPI April is here for follow up of his chronic medical problems.    Medications and allergies reviewed with patient and updated if appropriate.  Current Outpatient Medications on File Prior to Visit  Medication Sig Dispense Refill   albuterol (VENTOLIN HFA) 108 (90 Base) MCG/ACT inhaler Inhale 2 puffs into the lungs every 6 (six) hours as needed for wheezing or shortness of breath. 8 g 2   fluticasone (FLOVENT HFA) 110 MCG/ACT inhaler Inhale 2 puffs into the lungs 2 (two) times daily. 1 each 5   ibuprofen (ADVIL) 600 MG tablet Take 1 tablet (600 mg total) by mouth every 6 (six) hours as needed. 30 tablet 0   No current facility-administered medications on file prior to visit.     Review of Systems     Objective:  There were no vitals filed for this visit. BP Readings from Last 3 Encounters:  02/07/22 (!) 132/91  12/01/21 121/82  08/26/21 (!) 130/93   Wt Readings from Last 3 Encounters:  08/26/21 209 lb (94.8 kg)  06/28/21 214 lb 12.8 oz (97.4 kg)  06/22/21 210 lb 15.7 oz (95.7 kg)   There is no height or weight on file to calculate BMI.    Physical Exam     Lab Results  Component Value Date   WBC 5.1 05/25/2021   HGB 17.2 (H) 05/25/2021   HCT 52.7 (H) 05/25/2021   PLT 293.0 05/25/2021   GLUCOSE 89 05/25/2021   CHOL 180 05/25/2021   TRIG 159.0 (H) 05/25/2021   HDL 23.90 (L) 05/25/2021   LDLCALC 124 (H) 05/25/2021   ALT 26 05/25/2021   AST 19 05/25/2021   NA 138 05/25/2021   K 3.6 05/25/2021   CL 103 05/25/2021   CREATININE 0.87 05/25/2021   BUN 13 05/25/2021   CO2 29 05/25/2021   TSH 4.23 05/25/2021   HGBA1C 5.9 05/25/2021     Assessment & Plan:    See Problem List for Assessment and Plan of chronic medical problems.

## 2022-10-26 ENCOUNTER — Ambulatory Visit: Payer: Self-pay | Admitting: Internal Medicine

## 2022-10-26 DIAGNOSIS — R7303 Prediabetes: Secondary | ICD-10-CM

## 2022-10-26 DIAGNOSIS — J4520 Mild intermittent asthma, uncomplicated: Secondary | ICD-10-CM

## 2023-09-01 ENCOUNTER — Ambulatory Visit: Payer: Self-pay | Admitting: Internal Medicine

## 2024-01-11 ENCOUNTER — Other Ambulatory Visit: Payer: Self-pay

## 2024-01-11 ENCOUNTER — Emergency Department (HOSPITAL_BASED_OUTPATIENT_CLINIC_OR_DEPARTMENT_OTHER)
Admission: EM | Admit: 2024-01-11 | Discharge: 2024-01-11 | Disposition: A | Discharge status: Home / Self Care | Payer: Self-pay

## 2024-01-11 DIAGNOSIS — N5089 Other specified disorders of the male genital organs: Secondary | ICD-10-CM | POA: Insufficient documentation

## 2024-01-11 DIAGNOSIS — Z113 Encounter for screening for infections with a predominantly sexual mode of transmission: Secondary | ICD-10-CM | POA: Insufficient documentation

## 2024-01-11 DIAGNOSIS — Z7951 Long term (current) use of inhaled steroids: Secondary | ICD-10-CM | POA: Insufficient documentation

## 2024-01-11 DIAGNOSIS — L989 Disorder of the skin and subcutaneous tissue, unspecified: Secondary | ICD-10-CM

## 2024-01-11 DIAGNOSIS — J45909 Unspecified asthma, uncomplicated: Secondary | ICD-10-CM | POA: Insufficient documentation

## 2024-01-11 LAB — HIV ANTIBODY (ROUTINE TESTING W REFLEX): HIV Screen 4th Generation wRfx: NONREACTIVE

## 2024-01-11 NOTE — ED Triage Notes (Signed)
 Pt POV reporting rash to genital area x1 week, denies discharge or dysuria.

## 2024-01-11 NOTE — Discharge Instructions (Signed)
 Testing results will be available within the next 24-36 hours.  You may follow this on MyChart.  Will call you if something is positive.

## 2024-01-11 NOTE — ED Provider Notes (Signed)
 [place] EMERGENCY DEPARTMENT AT [facility] Provider Note   CSN: [id number] Arrival date & time: 01/11/24  8176     Patient presents with: No chief complaint on file.   Danny Rubio is a 38 y.o. male with a past medical history of asthma presents to the Emergency Department for evaluation of lesions to perineum that he noticed 2 weeks ago. No known exposure to STD. Hasn't had sexual intercourse in a long time. Denies pain, pruritus, penile discharge, testicular swelling, testicular pain, and fevers    HPI     Prior to Admission medications   Medication Sig Start Date End Date Taking? Authorizing Provider  albuterol  (VENTOLIN  HFA) 108 (90 Base) MCG/ACT inhaler Inhale 2 puffs into the lungs every 6 (six) hours as needed for wheezing or shortness of breath. 06/28/21   Geofm Glade PARAS, MD  fluticasone  (FLOVENT  HFA) 110 MCG/ACT inhaler Inhale 2 puffs into the lungs 2 (two) times daily. 06/28/21   Geofm Glade PARAS, MD  ibuprofen  (ADVIL ) 600 MG tablet Take 1 tablet (600 mg total) by mouth every 6 (six) hours as needed. 08/26/21   Haviland, Julie, MD    Allergies: Patient has no known allergies.    Review of Systems  Skin:  Positive for wound.    Updated Vital Signs BP (!) 124/90 (BP Location: Right Arm)   Pulse 89   Temp 98.4 F (36.9 C)   Resp 16   Ht 5' 8 (1.727 m)   Wt 98 kg   SpO2 95%   BMI 32.84 kg/m   Physical Exam Vitals and nursing note reviewed. Exam conducted with a chaperone present.  Constitutional:      General: He is not in acute distress.    Appearance: Normal appearance.  HENT:     Head: Normocephalic and atraumatic.  Eyes:     Conjunctiva/sclera: Conjunctivae normal.  Cardiovascular:     Rate and Rhythm: Normal rate.  Pulmonary:     Effort: Pulmonary effort is normal. No respiratory distress.  Genitourinary:  Skin:    Coloration: Skin is not jaundiced or pale.  Neurological:     Mental Status: He is alert. Mental status is at baseline.      (all labs ordered are listed, but only abnormal results are displayed) Labs Reviewed  RPR  HIV ANTIBODY (ROUTINE TESTING W REFLEX)  GC/CHLAMYDIA PROBE AMP ([place]) NOT AT [facility]    EKG: None  Radiology: No results found.  Medications Ordered in the ED - No data to display                                  Medical Decision Making Amount and/or Complexity of Data Reviewed Labs: ordered.   Patient presents to the ED for concern of rash, this involves an extensive number of treatment options, and is a complaint that carries with it a high risk of complications and morbidity.  The differential diagnosis includes chancre from syphilis, genital warts, cellulitis, infection, skin tag   Co morbidities that complicate the patient evaluation  None   Additional history obtained:  Additional history obtained from  Nursing   External records from outside source obtained and reviewed including triage note   Lab Tests:  I Ordered, and personally interpreted labs.   Labs pending    Problem List / ED Course:  Screening exam for STD Lesion of male perineum Appears to be a fleshy growth which  may be consistent with genital warts versus a skin tag.  I did discuss that we do not test for genital warts here Emergency Department he can follow-up with his PCP or health department to test for this He does not believe he is exposed to an STD but wants testing for it Otherwise no other symptoms.  No testicular swelling nor tenderness.  No penile discharge nor urinary symptoms Lesion is noninfectious appearing.  No streaking, cellulitis, redness, erythema, fluctuance, nor drainage.  Vital signs WNL with no fever or tachycardia Discussed that lab work will take 24-36 hours to come back and he can follow-up on MyChart Shared decision-making is had with patient regarding treatment now for suspected STD versus waiting for results.  He wishes to wait for results and this is reasonable  as he is not currently having any symptoms nor does he have known exposure.  He can follow-up on MyChart or we will call him if he needs to be treated Patient will follow-up with PCP   Reevaluation:  After the interventions noted above, I reevaluated the patient and found that they have :stayed the same   Social Determinants of Health:  Former tobacco abuse PCP follow-up   Dispostion:  After consideration of the diagnostic results and the patients response to treatment, I feel that the patent would benefit from outpatient management symptomatic care.   Discussed ED workup, disposition, return to ED precautions with patient who expresses understanding agrees with plan.  All questions answered to their satisfaction.  They are agreeable to plan.  Discharge instructions provided on paperwork  Final diagnoses:  Screening examination for STD (sexually transmitted disease)  Lesion of male perineum    ED Discharge Orders     None        Minnie Tinnie BRAVO, PA 01/15/24 1616    Kammerer, Megan L, DO 01/21/24 [phone]

## 2024-01-12 LAB — RPR: RPR Ser Ql: NONREACTIVE

## 2024-01-12 LAB — GC/CHLAMYDIA PROBE AMP (~~LOC~~) NOT AT ARMC
Chlamydia: NEGATIVE
Comment: NEGATIVE
Comment: NORMAL
Neisseria Gonorrhea: NEGATIVE

## 2024-01-26 ENCOUNTER — Encounter (HOSPITAL_COMMUNITY): Payer: Self-pay

## 2024-01-26 ENCOUNTER — Emergency Department (HOSPITAL_COMMUNITY): Payer: Self-pay

## 2024-01-26 ENCOUNTER — Other Ambulatory Visit: Payer: Self-pay

## 2024-01-26 ENCOUNTER — Emergency Department (HOSPITAL_COMMUNITY)
Admission: EM | Admit: 2024-01-26 | Discharge: 2024-01-26 | Disposition: A | Discharge status: Home / Self Care | Payer: Self-pay | Attending: Emergency Medicine | Admitting: Emergency Medicine

## 2024-01-26 DIAGNOSIS — Y9302 Activity, running: Secondary | ICD-10-CM | POA: Insufficient documentation

## 2024-01-26 DIAGNOSIS — S92324A Nondisplaced fracture of second metatarsal bone, right foot, initial encounter for closed fracture: Secondary | ICD-10-CM | POA: Insufficient documentation

## 2024-01-26 DIAGNOSIS — W231XXA Caught, crushed, jammed, or pinched between stationary objects, initial encounter: Secondary | ICD-10-CM | POA: Insufficient documentation

## 2024-01-26 DIAGNOSIS — S92301A Fracture of unspecified metatarsal bone(s), right foot, initial encounter for closed fracture: Secondary | ICD-10-CM

## 2024-01-26 MED ORDER — OXYCODONE HCL 5 MG PO TABS: 5.0000 mg | ORAL_TABLET | ORAL | 0 refills | Status: DC | PRN

## 2024-01-26 MED ORDER — IBUPROFEN 600 MG PO TABS: 600.0000 mg | ORAL_TABLET | Freq: Four times a day (QID) | ORAL | 0 refills | Status: AC | PRN

## 2024-01-26 NOTE — ED Provider Notes (Signed)
 [place] EMERGENCY DEPARTMENT AT [facility] Provider Note   CSN: [id number] Arrival date & time: 01/26/24  1601     Patient presents with: Foot Pain   Danny Rubio is a 38 y.o. male reportedly otherwise healthy presents to the emergency department today for evaluation of right foot pain.  Patient reports that yesterday he was running and got his foot caught on a concrete block.  Has been having pain since then.  Denies any numbness or tingling.  No medication taken prior to arrival.  No previous surgeries to the foot.  Denies any other injury.   Foot Pain       Prior to Admission medications   Medication Sig Start Date End Date Taking? Authorizing Provider  albuterol  (VENTOLIN  HFA) 108 (90 Base) MCG/ACT inhaler Inhale 2 puffs into the lungs every 6 (six) hours as needed for wheezing or shortness of breath. 06/28/21   Geofm Glade PARAS, MD  fluticasone  (FLOVENT  HFA) 110 MCG/ACT inhaler Inhale 2 puffs into the lungs 2 (two) times daily. 06/28/21   Geofm Glade PARAS, MD  ibuprofen  (ADVIL ) 600 MG tablet Take 1 tablet (600 mg total) by mouth every 6 (six) hours as needed. 08/26/21   Haviland, Julie, MD    Allergies: Patient has no known allergies.    Review of Systems  Constitutional:  Negative for chills and fever.  Musculoskeletal:  Positive for arthralgias.    Updated Vital Signs BP 135/81 (BP Location: Right Arm)   Pulse 81   Temp 98.5 F (36.9 C) (Oral)   Resp 18   Ht 5' 8 (1.727 m)   Wt 97.9 kg   SpO2 100%   BMI 32.82 kg/m   Physical Exam Vitals and nursing note reviewed.  Constitutional:      General: He is not in acute distress.    Appearance: He is not ill-appearing or toxic-appearing.     Comments: On phone in no acute distress  HENT:     Mouth/Throat:     Comments: Orthodontia present Eyes:     General: No scleral icterus. Pulmonary:     Effort: Pulmonary effort is normal. No respiratory distress.  Musculoskeletal:        General: Tenderness  present.     Comments: Tenderness to the dorsum of the right foot.  No tenderness to the lateral or medial malleoli.  No tenderness into the ankle or lower leg.  Palpable DP and PT pulses.  Compartments are soft.  Does have some swelling to the dorsum of the right foot as well.  Brisk cap refill.  Patient able to flex and extend foot but does have some pain with doing so.  Onychomycosis present. Skin intact.   Skin:    General: Skin is warm and dry.  Neurological:     Mental Status: He is alert.     (all labs ordered are listed, but only abnormal results are displayed) Labs Reviewed - No data to display  EKG: None  Radiology: DG Foot Complete Right Result Date: 01/26/2024 CLINICAL DATA:  blunt trauma, right foot pain EXAM: RIGHT FOOT COMPLETE - 3+ VIEW COMPARISON:  None Available. FINDINGS: Subtle, nondisplaced fracture through the second metatarsal neck. There is no evidence of arthropathy or other focal bone abnormality. Soft tissue swelling about the dorsum of the foot. No radiopaque foreign body. IMPRESSION: Nondisplaced fracture through the second metatarsal neck. Electronically Signed   By: Rogelia Myers M.D.   On: 01/26/2024 17:43   Procedures  Medications Ordered in the ED - No data to display    Medical Decision Making Amount and/or Complexity of Data Reviewed Radiology: ordered.   38 y.o. male presents to the ER for evaluation of right foot pain. Differential diagnosis includes but is not limited to trauma, contusion, soft tissue injury, bony fracture, dislocation. Vital signs unremarkable. Physical exam as noted above.   Patient neurovasc intact distally.  Hartman's are soft.  Does have some overlying swelling and tenderness.  Will order x-ray to rule out any bony fracture.  X-ray shows  Nondisplaced fracture through the second metatarsal neck. Per radiologist's interpretation.    Patient is neurovasc intact distally.  Will provide him a postop shoe and crutches.   Will give him follow-up with podiatry.  Patient did drive himself here so limited on pain medication.  Will prescribe him a short course of narcotics for breakthrough pain.  Will recommend Tylenol  ibuprofen  as needed.  RICE method and splint instruction discussed.  Stable for discharge home.  We discussed the results of the labs/imaging. The plan is supportive care, RICE, follow-up with podiatry. We discussed strict return precautions and red flag symptoms. The patient verbalized their understanding and agrees to the plan. The patient is stable and being discharged home in good condition.  Portions of this report may have been transcribed using voice recognition software. Every effort was made to ensure accuracy; however, inadvertent computerized transcription errors may be present.    Final diagnoses:  Closed fracture of neck of metatarsal bone of right foot, initial encounter    ED Discharge Orders          Ordered    oxyCODONE (ROXICODONE) 5 MG immediate release tablet  Every 4 hours PRN        01/26/24 1821    ibuprofen  (ADVIL ) 600 MG tablet  Every 6 hours PRN        01/26/24 1821               Bernis Ernst, PA-C 01/26/24 1821    Armenta Canning, MD 02/05/24 1152

## 2024-01-26 NOTE — ED Triage Notes (Signed)
 Right foot pain after injuring 2 days ago. Pt is weight bearing, but has had continued pain and swelling.

## 2024-01-26 NOTE — Discharge Instructions (Addendum)
 You were seen in the emerged from today for evaluation of pain.  You did sustain a fracture from your injury.  You will need to leave the postop shoe on and use your crutches as needed.  You can still walk as tolerated.  For pain, recommend taking 1000 mg of Tylenol  and/or 600 mg of ibuprofen  every 6 hours as needed for pain.  I we will send you home with a short course of narcotic pain medication to take for breakthrough pain.  Please do not drive or operate any heavy machinery while on this medication as it will make you sleepy.  I have included information on the RICE method that we discussed previously.  Please review.  Additionally, you will need to follow-up with a podiatrist, please call the one listed in this paperwork for you.  If you have any concerns, new or worsening symptoms, please return to your nearest emergency department for reevaluation.  Contact a health care provider if: You have pain that gets worse or does not improve with medicine. You have a fever. You have a bad smell coming from your cast or splint or if the cast or splint gets wet. Get help right away if: You have any of the following in your toes or foot, even after loosening your splint (if you have one): Numbness. Tingling. Coldness. Blue skin. Redness or swelling that gets worse. You have pain that suddenly becomes severe.

## 2024-01-26 NOTE — Progress Notes (Signed)
 Orthopedic Tech Progress Note Patient Details:  Danny Rubio 10-04-1985 [id number]  Ortho Devices Type of Ortho Device: Postop shoe/boot Ortho Device/Splint Location: RLE Ortho Device/Splint Interventions: Application   Post Interventions Patient Tolerated: Well  Massie BRAVO Jatin Naumann 01/26/2024, 6:31 PM

## 2024-01-29 ENCOUNTER — Telehealth: Payer: Self-pay | Admitting: Internal Medicine

## 2024-01-29 ENCOUNTER — Ambulatory Visit: Payer: Self-pay

## 2024-01-29 ENCOUNTER — Other Ambulatory Visit: Payer: Self-pay | Admitting: Internal Medicine

## 2024-01-29 NOTE — Telephone Encounter (Signed)
  FYI Only or Action Required?: Action required by provider: medication refill request.  Patient was last seen in primary care on 06/28/2021 by Geofm Glade PARAS, MD.  Called Nurse Triage reporting Foot Injury.  Symptoms began several days ago.  Interventions attempted: OTC medications: Tylenol  and Prescription medications: Oxycodone.  Symptoms are: gradually worsening.  Triage Disposition: See Physician Within 24 Hours  Patient/caregiver understands and will follow disposition?: Yes           Copied from CRM #[id number]. Topic: Clinical - Red Word Triage >> Jan 29, 2024 11:57 AM Frederich PARAS wrote: [facility] that prompted transfer to Nurse Triage: pain  pt called, he hurt himself at work,. he think he have medicare. right foot is fractured. pt says he is in pain. swelling went down just alot of pain, I also sent  stacy burns admin a  crm in [place] to [facility] pt inquired about Reason for Disposition  [1] Limp when walking AND [2] due to a direct blow or crushing injury  Answer Assessment - Initial Assessment Questions Used ice on area for symptoms. Took ibuprofen  and oxycodone for symptoms. Patient requesting more oxycodone and muscle relaxer for symptoms. Seen in ED for symptoms  and follow up sceduled. Patient requesting medications to be sent to pharmacy below if possible.   [facility] [address] [place], [place] [address]  1. MECHANISM: How did the injury happen? (e.g., twisting injury, direct blow)      Foot ran over by a forklift  2. ONSET: When did the injury happen? (e.g., minutes or hours ago)      Last week  3. LOCATION: Where is the injury located?      R foot  4. APPEARANCE of INJURY: What does the injury look like?      Some slight swelling  5. WEIGHT-BEARING: Can you put weight on that foot? Can you walk (four steps or more)?       Able to but weight on foot has a shoe for symptoms.  6. SIZE: For cuts, bruises, or swelling, ask: How large is it? (e.g.,  inches or centimeters;  entire joint)      Denies  7. PAIN: Is there pain? If Yes, ask: How bad is the pain? What does it keep you from doing? (Scale 0-10; or none, mild, moderate, severe)     10/10 8. OTHER SYMPTOMS: Do you have any other symptoms?      Denies  Protocols used: Foot Injury-A-AH

## 2024-01-29 NOTE — Telephone Encounter (Unsigned)
 Copied from CRM [id number]. Topic: General - Other >> Jan 29, 2024 11:55 AM Danny Rubio PARAS wrote: Reason for CRM: pt wants to speak to pcp in regards to [facility], pt hurt himself a work.pt callback# is [phone]

## 2024-01-30 ENCOUNTER — Ambulatory Visit: Payer: Self-pay | Admitting: Podiatry

## 2024-01-30 ENCOUNTER — Encounter: Payer: Self-pay | Admitting: Internal Medicine

## 2024-01-30 NOTE — Patient Instructions (Incomplete)
   Medications changes include :   fluoxetine 20 mg daily,  xanax 0.5 mg twice daily as needed for panic attack.  Buspar 5 mg at night as needed for sleep.

## 2024-01-30 NOTE — Telephone Encounter (Signed)
 Spoke with patient today.

## 2024-01-30 NOTE — Progress Notes (Unsigned)
 Subjective:    Patient ID: Danny Rubio, male    DOB: [birth year], 38 y.o.   MRN: [id number]     HPI Husayn is here for follow up of his chronic medical problems.  ED 10/3 ----   10/2 - injury of right foot while at  work. 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 He did not notify anyone at work this happened - initially thought the foot was ok - later it hurt more.   He went to the ED.  He was having pain since the day prior.  He denied any numbness, tingling.  X-ray of the right foot showed a nondisplaced fracture through the second metatarsal neck.  On exam he was neurovascularly intact.  He was advised RICE, follow-up with podiatry.  He was prescribed oxycodone 5 mg every 4 hours as needed and ibuprofen  600 mg every 6 hours as needed.   Was supposed to see podiatry today -- cancelled the appointment because he does not have insurance and they wanted payment up front.    He has been elevating his foot, icing it and resting it.  The pain is much less.  The swelling has improved.   Pain walking from waiting room - pain was ok - not too much pain.    Wants to return to work tonight - was off the past couple of nights but does not think he will be able to stand for long periods.  Was given note from the ED.    Medications and allergies reviewed with patient and updated if appropriate.  Current Outpatient Medications on File Prior to Visit  Medication Sig Dispense Refill   albuterol  (VENTOLIN  HFA) 108 (90 Base) MCG/ACT inhaler Inhale 2 puffs into the lungs every 6 (six) hours as needed for wheezing or shortness of breath. 8 g 2   fluticasone  (FLOVENT  HFA) 110 MCG/ACT inhaler Inhale 2 puffs into the lungs 2 (two) times daily. 1 each 5   ibuprofen  (ADVIL ) 600 MG tablet Take 1 tablet (600 mg total) by mouth every 6 (six) hours as needed. 30 tablet 0   oxyCODONE (ROXICODONE) 5 MG immediate release tablet Take 1 tablet (5 mg total) by mouth every 4 (four) hours as needed for severe pain  (pain score 7-10). 7 tablet 0   No current facility-administered medications on file prior to visit.     Review of Systems  Constitutional:  Negative for chills and fever.  Skin:  Negative for color change.       Objective:   Vitals:   01/31/24 1354  BP: 120/84  Pulse: 79  Temp: 98.3 F (36.8 C)  SpO2: 97%   BP Readings from Last 3 Encounters:  01/31/24 120/84  01/26/24 135/81  01/11/24 (!) 124/90   Wt Readings from Last 3 Encounters:  01/31/24 215 lb (97.5 kg)  01/26/24 215 lb 13.3 oz (97.9 kg)  01/11/24 216 lb (98 kg)   Body mass index is 32.69 kg/m.    Physical Exam Constitutional:      General: He is not in acute distress.    Appearance: Normal appearance. He is not ill-appearing.  HENT:     Head: Normocephalic and atraumatic.  Musculoskeletal:        General: Swelling (mild swelling at base of toes 2-4) and tenderness (mild tenderness in distal dorsal foot near toes 2-4) present.  Skin:    General: Skin is warm and dry.     Findings: Bruising (small  bruise lateral foot) present. No erythema, lesion or rash.  Neurological:     Mental Status: He is alert.        Lab Results  Component Value Date   WBC 5.1 05/25/2021   HGB 17.2 (H) 05/25/2021   HCT 52.7 (H) 05/25/2021   PLT 293.0 05/25/2021   GLUCOSE 89 05/25/2021   CHOL 180 05/25/2021   TRIG 159.0 (H) 05/25/2021   HDL 23.90 (L) 05/25/2021   LDLCALC 124 (H) 05/25/2021   ALT 26 05/25/2021   AST 19 05/25/2021   NA 138 05/25/2021   K 3.6 05/25/2021   CL 103 05/25/2021   CREATININE 0.87 05/25/2021   BUN 13 05/25/2021   CO2 29 05/25/2021   TSH 4.23 05/25/2021   HGBA1C 5.9 05/25/2021     Assessment & Plan:    See Problem List for Assessment and Plan of chronic medical problems.   I personally spent a total of 21 minutes in the care of the patient today including preparing to see the patient, getting/reviewing separately obtained history, performing a medically appropriate exam/evaluation,  documenting clinical information in the EHR, and writing note for work.

## 2024-01-31 ENCOUNTER — Ambulatory Visit (INDEPENDENT_AMBULATORY_CARE_PROVIDER_SITE_OTHER): Payer: Self-pay | Admitting: Internal Medicine

## 2024-01-31 VITALS — BP 120/84 | HR 79 | Temp 98.3°F | Ht 68.0 in | Wt 215.0 lb

## 2024-01-31 DIAGNOSIS — S92321A Displaced fracture of second metatarsal bone, right foot, initial encounter for closed fracture: Secondary | ICD-10-CM | POA: Insufficient documentation

## 2024-01-31 DIAGNOSIS — S92324A Nondisplaced fracture of second metatarsal bone, right foot, initial encounter for closed fracture: Secondary | ICD-10-CM

## 2024-01-31 NOTE — Assessment & Plan Note (Signed)
 Acute Injury at work 10/2 - went to ED 10/3 Xray showed fracture Advised RICE, follow-up with podiatry and given pain medication-oxycodone and ibuprofen  Since then he has been elevating his foot and resting it Pain and swelling have improved Works at the [employer] and wants to return to work, but cannot stand on his feet long periods and therefore will need light duty-knee note given.  He can return to work He will discuss with his manager and let us  know if he needs anything further from us  Would recommend Tylenol  or ibuprofen  for pain Continue wearing surgical shoe Follow-up as needed

## 2024-02-01 ENCOUNTER — Telehealth: Payer: Self-pay

## 2024-02-01 NOTE — Telephone Encounter (Signed)
 Will review forms to make sure we can complete them since he got hurt on his job. Could be potentially work comp forms.

## 2024-02-01 NOTE — Telephone Encounter (Signed)
 Copied from CRM [id number]. Topic: General - Other >> Feb 01, 2024  1:01 PM Rosina BIRCH wrote: Reason for CRM: patient called stating he got hurt on his job and need paperwork to be filled out for his employer. Patient stated he will bring the paperwork tomorrow [phone]

## 2024-02-02 ENCOUNTER — Telehealth: Payer: Self-pay | Admitting: Internal Medicine

## 2024-02-02 NOTE — Telephone Encounter (Signed)
 Patient dropped off document Request for Light Duty/Duty status Report, to be filled out by provider. Patient requested to send it back via Call Patient to pick up within 7-days. Document is located in providers tray at front office.Please advise at Mobile [phone] (mobile)

## 2024-02-02 NOTE — Telephone Encounter (Signed)
 doc

## 2024-02-04 ENCOUNTER — Encounter: Payer: Self-pay | Admitting: Internal Medicine

## 2024-02-05 NOTE — Telephone Encounter (Signed)
 Copied from CRM [id number]. Topic: General - Other >> Feb 05, 2024 10:50 AM Pinkey ORN wrote: Reason for CRM: Work Documentation >> Feb 05, 2024 10:55 AM Pinkey ORN wrote: Patient states he brought in work accomodation documents for Danny Glade PARAS, MD  to fill out and was wanting to know the process status. I contacted [employer] where I was informed patient brought the documents in on Friday, therefor it hasn't been completed. I advised patient that t typically it takes between 7-10 business days. Patient states he just needs something to submit to his employer prior to being out of work.

## 2024-02-07 NOTE — Telephone Encounter (Signed)
 Copied from CRM [id number]. Topic: General - Other >> Feb 07, 2024 12:20 PM Paige D wrote: Reason for CRM: Pt is calling in regards to paper work he needs filled out for work he said he brought paper work in last Friday. Pt needs status on FMLA Called [employer] pt dropped paper work off [employer] stated should be completed by Friday 02/09/2024.  Patient would Please like a phone call when Dail is completed and  if any more info is needed.

## 2024-02-08 NOTE — Telephone Encounter (Signed)
 Paperwork completed and faxed.

## 2024-02-09 NOTE — Telephone Encounter (Signed)
 Copied from CRM [id number]. Topic: General - Other >> Feb 07, 2024 12:20 PM Paige D wrote: Reason for CRM: Pt is calling in regards to paper work he needs filled out for work he said he brought paper work in last Friday. Pt needs status on FMLA Called [employer] pt dropped paper work off [employer] stated should be completed by Friday 02/09/2024.  Patient would Please like a phone call when Dail is completed and  if any more info is needed. >> Feb 09, 2024 12:33 PM Roselie BROCKS wrote: On front on right side,not the back.  Either for restrictions for out of work, 15 through 20 has to be filled out, and needs to be signed by the provider not a NP. Fax to [phone]. Patient request a return call concerning this.  >> Feb 09, 2024 12:29 PM Roselie BROCKS wrote: Patient just called in once more for paperwork , I let patient know its states its faxed in, but patient states the back is not filled out at all, and it is not signed, and patients employer needs the corrected forms. >> Feb 08, 2024  8:46 AM Macario HERO wrote: Patient called regarding FMLA paperwork and requesting to pick them up from the office when completed.

## 2024-02-09 NOTE — Telephone Encounter (Signed)
 Patient came to office and said a portion of the paperwork has not been fully completed. Paperwork has been placed in provider box for completion. Please contact patient at [phone] for pickup.

## 2024-02-12 NOTE — Telephone Encounter (Signed)
 Copied from CRM #[id number]. Topic: General - Other >> Feb 12, 2024 11:01 AM Berneda FALCON wrote: Reason for CRM: Patient is calling in requesting to speak to [facility] about the documents that he dropped off. He would like these documents faxed over to his chiropractor as well, please.  Chiropractor fax- [phone]  Patient callback is [phone]

## 2024-02-13 NOTE — Telephone Encounter (Signed)
 Copied from CRM #[id number]. Topic: General - Other >> Feb 13, 2024  9:24 AM Viola F wrote: Reason for CRM: Patient called to follow up on form that was dropped off last Friday, he needs the right side of the form filled out asap in order to get paid from work since he is out. His job says he needs his toe covered in order to work - he hasn't worked since 02/04/24. He asked for a call back today with an update, please fax form directly to his job at [phone] and Chiropractor at [phone]

## 2024-02-15 NOTE — Telephone Encounter (Signed)
 Paperwork complete and on my desk.  Waiting for patient to respond regarding restrictions.
